# Patient Record
Sex: Female | Born: 1950 | Race: White | Hispanic: No | Marital: Married | Smoking: Never smoker
Health system: Southern US, Community
[De-identification: ages and names within clinical notes are randomized; demographics above are authoritative.]

## PROBLEM LIST (undated history)

## (undated) DIAGNOSIS — O24419 Gestational diabetes mellitus in pregnancy, unspecified control: Secondary | ICD-10-CM

## (undated) DIAGNOSIS — N281 Cyst of kidney, acquired: Secondary | ICD-10-CM

## (undated) DIAGNOSIS — B009 Herpesviral infection, unspecified: Secondary | ICD-10-CM

## (undated) DIAGNOSIS — D219 Benign neoplasm of connective and other soft tissue, unspecified: Secondary | ICD-10-CM

## (undated) DIAGNOSIS — H269 Unspecified cataract: Secondary | ICD-10-CM

## (undated) DIAGNOSIS — F32A Depression, unspecified: Secondary | ICD-10-CM

## (undated) DIAGNOSIS — F329 Major depressive disorder, single episode, unspecified: Secondary | ICD-10-CM

## (undated) DIAGNOSIS — B379 Candidiasis, unspecified: Secondary | ICD-10-CM

## (undated) DIAGNOSIS — G2581 Restless legs syndrome: Secondary | ICD-10-CM

## (undated) DIAGNOSIS — Z8619 Personal history of other infectious and parasitic diseases: Secondary | ICD-10-CM

## (undated) DIAGNOSIS — C50919 Malignant neoplasm of unspecified site of unspecified female breast: Secondary | ICD-10-CM

## (undated) DIAGNOSIS — E785 Hyperlipidemia, unspecified: Secondary | ICD-10-CM

## (undated) DIAGNOSIS — Z87898 Personal history of other specified conditions: Secondary | ICD-10-CM

## (undated) DIAGNOSIS — K219 Gastro-esophageal reflux disease without esophagitis: Secondary | ICD-10-CM

## (undated) DIAGNOSIS — N63 Unspecified lump in unspecified breast: Secondary | ICD-10-CM

## (undated) HISTORY — PX: ABDOMINAL HYSTERECTOMY: SHX81

## (undated) HISTORY — DX: Benign neoplasm of connective and other soft tissue, unspecified: D21.9

## (undated) HISTORY — DX: Herpesviral infection, unspecified: B00.9

## (undated) HISTORY — DX: Hyperlipidemia, unspecified: E78.5

## (undated) HISTORY — DX: Personal history of other specified conditions: Z87.898

## (undated) HISTORY — DX: Unspecified cataract: H26.9

## (undated) HISTORY — DX: Gestational diabetes mellitus in pregnancy, unspecified control: O24.419

## (undated) HISTORY — DX: Malignant neoplasm of unspecified site of unspecified female breast: C50.919

## (undated) HISTORY — DX: Unspecified lump in unspecified breast: N63.0

## (undated) HISTORY — PX: TUBAL LIGATION: SHX77

## (undated) HISTORY — DX: Restless legs syndrome: G25.81

## (undated) HISTORY — DX: Major depressive disorder, single episode, unspecified: F32.9

## (undated) HISTORY — DX: Personal history of other infectious and parasitic diseases: Z86.19

## (undated) HISTORY — DX: Gastro-esophageal reflux disease without esophagitis: K21.9

## (undated) HISTORY — PX: TONSILLECTOMY: SHX5217

## (undated) HISTORY — DX: Candidiasis, unspecified: B37.9

## (undated) HISTORY — DX: Depression, unspecified: F32.A

## (undated) HISTORY — DX: Cyst of kidney, acquired: N28.1

## (undated) HISTORY — PX: WISDOM TOOTH EXTRACTION: SHX21

---

## 1997-08-12 ENCOUNTER — Ambulatory Visit (HOSPITAL_COMMUNITY): Admission: RE | Admit: 1997-08-12 | Discharge: 1997-08-12 | Payer: Self-pay | Admitting: Specialist

## 1998-03-07 HISTORY — PX: BILATERAL SALPINGOOPHORECTOMY: SHX1223

## 1998-03-07 HISTORY — PX: TOTAL ABDOMINAL HYSTERECTOMY W/ BILATERAL SALPINGOOPHORECTOMY: SHX83

## 1998-04-07 ENCOUNTER — Other Ambulatory Visit: Admission: RE | Admit: 1998-04-07 | Discharge: 1998-04-07 | Payer: Self-pay | Admitting: Obstetrics and Gynecology

## 1998-05-12 ENCOUNTER — Inpatient Hospital Stay (HOSPITAL_COMMUNITY): Admission: RE | Admit: 1998-05-12 | Discharge: 1998-05-14 | Payer: Self-pay | Admitting: Obstetrics and Gynecology

## 2001-08-21 ENCOUNTER — Other Ambulatory Visit: Admission: RE | Admit: 2001-08-21 | Discharge: 2001-08-21 | Payer: Self-pay | Admitting: *Deleted

## 2001-09-04 ENCOUNTER — Other Ambulatory Visit: Admission: RE | Admit: 2001-09-04 | Discharge: 2001-09-04 | Payer: Self-pay | Admitting: Radiology

## 2001-09-04 DIAGNOSIS — C50919 Malignant neoplasm of unspecified site of unspecified female breast: Secondary | ICD-10-CM

## 2001-09-04 HISTORY — DX: Malignant neoplasm of unspecified site of unspecified female breast: C50.919

## 2001-09-12 ENCOUNTER — Other Ambulatory Visit: Admission: RE | Admit: 2001-09-12 | Discharge: 2001-09-12 | Payer: Self-pay | Admitting: Radiology

## 2001-09-24 ENCOUNTER — Encounter: Payer: Self-pay | Admitting: Surgery

## 2001-09-24 ENCOUNTER — Encounter: Admission: RE | Admit: 2001-09-24 | Discharge: 2001-09-24 | Payer: Self-pay | Admitting: Surgery

## 2001-09-26 ENCOUNTER — Encounter: Admission: RE | Admit: 2001-09-26 | Discharge: 2001-09-26 | Payer: Self-pay | Admitting: Surgery

## 2001-09-26 ENCOUNTER — Encounter: Payer: Self-pay | Admitting: Surgery

## 2001-09-26 ENCOUNTER — Encounter (INDEPENDENT_AMBULATORY_CARE_PROVIDER_SITE_OTHER): Payer: Self-pay | Admitting: *Deleted

## 2001-09-26 ENCOUNTER — Ambulatory Visit (HOSPITAL_BASED_OUTPATIENT_CLINIC_OR_DEPARTMENT_OTHER): Admission: RE | Admit: 2001-09-26 | Discharge: 2001-09-27 | Payer: Self-pay | Admitting: Surgery

## 2001-09-26 HISTORY — PX: AXILLARY LYMPH NODE DISSECTION: SHX5229

## 2001-09-26 HISTORY — PX: BREAST LUMPECTOMY: SHX2

## 2001-09-27 ENCOUNTER — Ambulatory Visit: Admission: RE | Admit: 2001-09-27 | Discharge: 2001-12-26 | Payer: Self-pay | Admitting: Radiation Oncology

## 2001-10-01 ENCOUNTER — Ambulatory Visit (HOSPITAL_COMMUNITY): Admission: RE | Admit: 2001-10-01 | Discharge: 2001-10-01 | Payer: Self-pay | Admitting: Oncology

## 2001-10-01 ENCOUNTER — Encounter: Payer: Self-pay | Admitting: Oncology

## 2001-10-04 ENCOUNTER — Ambulatory Visit (HOSPITAL_COMMUNITY): Admission: RE | Admit: 2001-10-04 | Discharge: 2001-10-04 | Payer: Self-pay | Admitting: Oncology

## 2001-10-04 ENCOUNTER — Encounter: Payer: Self-pay | Admitting: Oncology

## 2001-10-12 ENCOUNTER — Encounter: Payer: Self-pay | Admitting: Cardiology

## 2001-10-12 ENCOUNTER — Ambulatory Visit: Admission: RE | Admit: 2001-10-12 | Discharge: 2001-10-12 | Payer: Self-pay | Admitting: Oncology

## 2001-10-18 ENCOUNTER — Encounter: Payer: Self-pay | Admitting: Surgery

## 2001-10-18 ENCOUNTER — Ambulatory Visit (HOSPITAL_BASED_OUTPATIENT_CLINIC_OR_DEPARTMENT_OTHER): Admission: RE | Admit: 2001-10-18 | Discharge: 2001-10-18 | Payer: Self-pay | Admitting: Surgery

## 2002-01-09 ENCOUNTER — Ambulatory Visit: Admission: RE | Admit: 2002-01-09 | Discharge: 2002-01-09 | Payer: Self-pay | Admitting: Oncology

## 2002-01-09 ENCOUNTER — Encounter: Payer: Self-pay | Admitting: Cardiovascular Disease

## 2002-02-20 ENCOUNTER — Encounter: Payer: Self-pay | Admitting: Cardiovascular Disease

## 2002-02-20 ENCOUNTER — Ambulatory Visit: Admission: RE | Admit: 2002-02-20 | Discharge: 2002-02-20 | Payer: Self-pay | Admitting: Oncology

## 2002-02-20 ENCOUNTER — Ambulatory Visit: Admission: RE | Admit: 2002-02-20 | Discharge: 2002-05-01 | Payer: Self-pay | Admitting: Radiation Oncology

## 2002-02-22 ENCOUNTER — Encounter: Admission: RE | Admit: 2002-02-22 | Discharge: 2002-02-22 | Payer: Self-pay | Admitting: Radiation Oncology

## 2002-04-02 ENCOUNTER — Ambulatory Visit: Admission: RE | Admit: 2002-04-02 | Discharge: 2002-04-02 | Payer: Self-pay | Admitting: Oncology

## 2002-04-02 ENCOUNTER — Encounter (INDEPENDENT_AMBULATORY_CARE_PROVIDER_SITE_OTHER): Payer: Self-pay | Admitting: Cardiology

## 2002-06-25 ENCOUNTER — Encounter (INDEPENDENT_AMBULATORY_CARE_PROVIDER_SITE_OTHER): Payer: Self-pay | Admitting: Cardiology

## 2002-06-25 ENCOUNTER — Ambulatory Visit: Admission: RE | Admit: 2002-06-25 | Discharge: 2002-06-25 | Payer: Self-pay | Admitting: Oncology

## 2002-10-11 ENCOUNTER — Ambulatory Visit (HOSPITAL_BASED_OUTPATIENT_CLINIC_OR_DEPARTMENT_OTHER): Admission: RE | Admit: 2002-10-11 | Discharge: 2002-10-11 | Payer: Self-pay | Admitting: Surgery

## 2002-12-24 ENCOUNTER — Encounter: Payer: Self-pay | Admitting: Oncology

## 2002-12-24 ENCOUNTER — Ambulatory Visit (HOSPITAL_COMMUNITY): Admission: RE | Admit: 2002-12-24 | Discharge: 2002-12-24 | Payer: Self-pay | Admitting: Oncology

## 2003-04-02 ENCOUNTER — Ambulatory Visit: Admission: RE | Admit: 2003-04-02 | Discharge: 2003-04-02 | Payer: Self-pay | Admitting: Oncology

## 2003-04-02 ENCOUNTER — Encounter: Payer: Self-pay | Admitting: Cardiology

## 2003-10-08 ENCOUNTER — Ambulatory Visit (HOSPITAL_COMMUNITY): Admission: RE | Admit: 2003-10-08 | Discharge: 2003-10-08 | Payer: Self-pay | Admitting: Oncology

## 2004-01-09 ENCOUNTER — Ambulatory Visit: Payer: Self-pay | Admitting: Oncology

## 2004-04-08 ENCOUNTER — Ambulatory Visit: Payer: Self-pay | Admitting: Oncology

## 2004-09-21 ENCOUNTER — Ambulatory Visit (HOSPITAL_COMMUNITY): Admission: RE | Admit: 2004-09-21 | Discharge: 2004-09-21 | Payer: Self-pay | Admitting: Oncology

## 2004-10-15 ENCOUNTER — Ambulatory Visit: Payer: Self-pay | Admitting: Oncology

## 2005-03-23 ENCOUNTER — Ambulatory Visit: Payer: Self-pay | Admitting: Cardiovascular Disease

## 2005-03-23 ENCOUNTER — Ambulatory Visit: Admission: RE | Admit: 2005-03-23 | Discharge: 2005-03-23 | Payer: Self-pay | Admitting: Oncology

## 2005-03-23 ENCOUNTER — Encounter: Payer: Self-pay | Admitting: Cardiovascular Disease

## 2005-04-15 ENCOUNTER — Ambulatory Visit: Payer: Self-pay | Admitting: Oncology

## 2005-10-24 ENCOUNTER — Ambulatory Visit: Payer: Self-pay | Admitting: Oncology

## 2005-10-31 LAB — COMPREHENSIVE METABOLIC PANEL
Albumin: 4.5 g/dL (ref 3.5–5.2)
Alkaline Phosphatase: 83 U/L (ref 39–117)
CO2: 26 mEq/L (ref 19–32)
Chloride: 104 mEq/L (ref 96–112)
Glucose, Bld: 95 mg/dL (ref 70–99)
Sodium: 140 mEq/L (ref 135–145)
Total Bilirubin: 0.6 mg/dL (ref 0.3–1.2)

## 2005-10-31 LAB — CBC WITH DIFFERENTIAL/PLATELET
HCT: 36.7 % (ref 34.8–46.6)
HGB: 12.9 g/dL (ref 11.6–15.9)
LYMPH%: 33.4 % (ref 14.0–48.0)
MCHC: 35 g/dL (ref 32.0–36.0)
MCV: 93.3 fL (ref 81.0–101.0)
NEUT%: 57.6 % (ref 39.6–76.8)
Platelets: 265 10*3/uL (ref 145–400)
RDW: 12.3 % (ref 11.3–14.5)
WBC: 4.3 10*3/uL (ref 3.9–10.0)

## 2005-10-31 LAB — RESEARCH LABS

## 2005-10-31 LAB — CANCER ANTIGEN 27.29: CA 27.29: 12 U/mL (ref 0–39)

## 2006-04-26 ENCOUNTER — Ambulatory Visit: Payer: Self-pay | Admitting: Oncology

## 2006-05-01 LAB — COMPREHENSIVE METABOLIC PANEL
ALT: 11 U/L (ref 0–35)
AST: 16 U/L (ref 0–37)
Calcium: 8.6 mg/dL (ref 8.4–10.5)
Chloride: 106 mEq/L (ref 96–112)
Creatinine, Ser: 0.75 mg/dL (ref 0.40–1.20)
Potassium: 4 mEq/L (ref 3.5–5.3)
Sodium: 139 mEq/L (ref 135–145)
Total Protein: 6.9 g/dL (ref 6.0–8.3)

## 2006-05-01 LAB — CBC WITH DIFFERENTIAL/PLATELET
Basophils Absolute: 0 10*3/uL (ref 0.0–0.1)
Eosinophils Absolute: 0 10*3/uL (ref 0.0–0.5)
HGB: 13.2 g/dL (ref 11.6–15.9)
MCV: 92.6 fL (ref 81.0–101.0)
MONO#: 0.2 10*3/uL (ref 0.1–0.9)
NEUT%: 46.9 % (ref 39.6–76.8)
Platelets: 242 10*3/uL (ref 145–400)
RBC: 4.05 10*6/uL (ref 3.70–5.32)
RDW: 11.9 % (ref 11.3–14.5)
WBC: 2.9 10*3/uL — ABNORMAL LOW (ref 3.9–10.0)
lymph#: 1.2 10*3/uL (ref 0.9–3.3)

## 2006-05-01 LAB — CANCER ANTIGEN 27.29: CA 27.29: 14 U/mL (ref 0–39)

## 2006-05-08 ENCOUNTER — Ambulatory Visit (HOSPITAL_COMMUNITY): Admission: RE | Admit: 2006-05-08 | Discharge: 2006-05-08 | Payer: Self-pay | Admitting: Oncology

## 2006-10-18 ENCOUNTER — Ambulatory Visit: Payer: Self-pay | Admitting: Oncology

## 2006-10-23 LAB — CBC WITH DIFFERENTIAL/PLATELET
BASO%: 0.3 % (ref 0.0–2.0)
EOS%: 1.5 % (ref 0.0–7.0)
HCT: 37 % (ref 34.8–46.6)
LYMPH%: 39.1 % (ref 14.0–48.0)
MCH: 33.1 pg (ref 26.0–34.0)
MCHC: 35.7 g/dL (ref 32.0–36.0)
NEUT%: 52.1 % (ref 39.6–76.8)
RBC: 3.99 10*6/uL (ref 3.70–5.32)
lymph#: 1.2 10*3/uL (ref 0.9–3.3)

## 2006-10-23 LAB — COMPREHENSIVE METABOLIC PANEL
AST: 17 U/L (ref 0–37)
Alkaline Phosphatase: 51 U/L (ref 39–117)
BUN: 11 mg/dL (ref 6–23)
Creatinine, Ser: 0.76 mg/dL (ref 0.40–1.20)
Potassium: 4.2 mEq/L (ref 3.5–5.3)

## 2007-02-16 ENCOUNTER — Ambulatory Visit: Payer: Self-pay | Admitting: Oncology

## 2007-02-20 LAB — COMPREHENSIVE METABOLIC PANEL
BUN: 15 mg/dL (ref 6–23)
CO2: 26 mEq/L (ref 19–32)
Creatinine, Ser: 0.89 mg/dL (ref 0.40–1.20)
Glucose, Bld: 97 mg/dL (ref 70–99)
Total Bilirubin: 0.6 mg/dL (ref 0.3–1.2)

## 2007-02-20 LAB — CBC WITH DIFFERENTIAL/PLATELET
Eosinophils Absolute: 0.1 10*3/uL (ref 0.0–0.5)
HCT: 37.9 % (ref 34.8–46.6)
LYMPH%: 44.7 % (ref 14.0–48.0)
MCV: 92.2 fL (ref 81.0–101.0)
MONO#: 0.3 10*3/uL (ref 0.1–0.9)
MONO%: 8.8 % (ref 0.0–13.0)
NEUT#: 1.5 10*3/uL (ref 1.5–6.5)
NEUT%: 43.5 % (ref 39.6–76.8)
Platelets: 237 10*3/uL (ref 145–400)
WBC: 3.3 10*3/uL — ABNORMAL LOW (ref 3.9–10.0)

## 2007-02-20 LAB — CANCER ANTIGEN 27.29: CA 27.29: 8 U/mL (ref 0–39)

## 2007-04-24 ENCOUNTER — Ambulatory Visit (HOSPITAL_COMMUNITY): Admission: RE | Admit: 2007-04-24 | Discharge: 2007-04-24 | Payer: Self-pay | Admitting: Oncology

## 2007-04-25 ENCOUNTER — Ambulatory Visit: Payer: Self-pay | Admitting: Oncology

## 2007-04-30 LAB — CBC WITH DIFFERENTIAL/PLATELET
BASO%: 0.6 % (ref 0.0–2.0)
EOS%: 1 % (ref 0.0–7.0)
HCT: 37.2 % (ref 34.8–46.6)
HGB: 13.1 g/dL (ref 11.6–15.9)
LYMPH%: 49.9 % — ABNORMAL HIGH (ref 14.0–48.0)
MONO%: 6.3 % (ref 0.0–13.0)
NEUT%: 42.2 % (ref 39.6–76.8)
RDW: 11.6 % (ref 11.3–14.5)
lymph#: 1.9 10*3/uL (ref 0.9–3.3)

## 2007-04-30 LAB — COMPREHENSIVE METABOLIC PANEL
AST: 23 U/L (ref 0–37)
BUN: 12 mg/dL (ref 6–23)
Calcium: 9.7 mg/dL (ref 8.4–10.5)
Glucose, Bld: 103 mg/dL — ABNORMAL HIGH (ref 70–99)
Potassium: 4.1 mEq/L (ref 3.5–5.3)
Sodium: 140 mEq/L (ref 135–145)

## 2007-04-30 LAB — RESEARCH LABS

## 2007-05-16 ENCOUNTER — Ambulatory Visit: Payer: Self-pay

## 2007-05-16 ENCOUNTER — Encounter: Payer: Self-pay | Admitting: Oncology

## 2007-10-12 ENCOUNTER — Ambulatory Visit: Payer: Self-pay | Admitting: Oncology

## 2008-04-15 ENCOUNTER — Ambulatory Visit: Payer: Self-pay | Admitting: Oncology

## 2009-03-24 ENCOUNTER — Emergency Department (HOSPITAL_COMMUNITY): Admission: EM | Admit: 2009-03-24 | Discharge: 2009-03-24 | Payer: Self-pay | Admitting: Emergency Medicine

## 2009-04-17 ENCOUNTER — Ambulatory Visit: Payer: Self-pay | Admitting: Oncology

## 2009-04-22 LAB — CBC WITH DIFFERENTIAL/PLATELET
Basophils Absolute: 0 10*3/uL (ref 0.0–0.1)
Eosinophils Absolute: 0.1 10*3/uL (ref 0.0–0.5)
MCH: 32.5 pg (ref 25.1–34.0)
MCHC: 34.5 g/dL (ref 31.5–36.0)
MCV: 94 fL (ref 79.5–101.0)
MONO#: 0.4 10*3/uL (ref 0.1–0.9)
NEUT%: 57 % (ref 38.4–76.8)
RBC: 4.02 10*6/uL (ref 3.70–5.45)
RDW: 12.2 % (ref 11.2–14.5)

## 2009-04-22 LAB — COMPREHENSIVE METABOLIC PANEL
ALT: 14 U/L (ref 0–35)
Albumin: 4.3 g/dL (ref 3.5–5.2)
Alkaline Phosphatase: 106 U/L (ref 39–117)
Calcium: 8.7 mg/dL (ref 8.4–10.5)
Creatinine, Ser: 0.83 mg/dL (ref 0.40–1.20)
Total Bilirubin: 0.3 mg/dL (ref 0.3–1.2)
Total Protein: 6.8 g/dL (ref 6.0–8.3)

## 2009-04-22 LAB — CANCER ANTIGEN 27.29: CA 27.29: 7 U/mL (ref 0–39)

## 2010-03-28 ENCOUNTER — Encounter: Payer: Self-pay | Admitting: Oncology

## 2010-04-27 ENCOUNTER — Encounter (HOSPITAL_BASED_OUTPATIENT_CLINIC_OR_DEPARTMENT_OTHER): Payer: BC Managed Care – PPO | Admitting: Oncology

## 2010-04-27 ENCOUNTER — Other Ambulatory Visit: Payer: Self-pay | Admitting: Oncology

## 2010-04-27 DIAGNOSIS — M949 Disorder of cartilage, unspecified: Secondary | ICD-10-CM

## 2010-04-27 DIAGNOSIS — C50419 Malignant neoplasm of upper-outer quadrant of unspecified female breast: Secondary | ICD-10-CM

## 2010-04-27 DIAGNOSIS — Z170 Estrogen receptor positive status [ER+]: Secondary | ICD-10-CM

## 2010-04-27 LAB — CBC WITH DIFFERENTIAL/PLATELET
Eosinophils Absolute: 0.1 10*3/uL (ref 0.0–0.5)
HCT: 38.9 % (ref 34.8–46.6)
MCHC: 33.9 g/dL (ref 31.5–36.0)
MCV: 92.8 fL (ref 79.5–101.0)
MONO#: 0.4 10*3/uL (ref 0.1–0.9)
Platelets: 218 10*3/uL (ref 145–400)
RBC: 4.19 10*6/uL (ref 3.70–5.45)
RDW: 12.1 % (ref 11.2–14.5)
WBC: 4 10*3/uL (ref 3.9–10.3)
lymph#: 1.3 10*3/uL (ref 0.9–3.3)

## 2010-07-23 NOTE — Op Note (Signed)
   NAME:  CARNELL, CASAMENTO                          ACCOUNT NO.:  [id number]   MEDICAL RECORD NO.:  [id number]                   PATIENT TYPE:  AMB   LOCATION:  DSC                                  FACILITY:  [facility]   PHYSICIAN:  Currie Paris, M.D.           DATE OF BIRTH:  06-Dec-1950   DATE OF PROCEDURE:  10/11/2002  DATE OF DISCHARGE:                                 OPERATIVE REPORT   PREOPERATIVE DIAGNOSIS:  Unneeded Port-A-Cath.   POSTOPERATIVE DIAGNOSIS:  Unneeded Port-A-Cath.   OPERATION:  Removal of Port-A-Cath.   SURGEON:  Currie Paris, M.D.   ANESTHESIA:  Local.   CLINICAL HISTORY:  This patient has finished all of her chemotherapy and  wished to have her Port-A-Cath removed.   DESCRIPTION OF PROCEDURE:  In the minor procedure room the patient confirmed  that Port-A-Cath removal was planned.  The area over the Port-A-Cath was  anesthetized with 1% Xylocaine using a little alcohol prep.  After about 10  minutes the area was prepped, draped, and the old scar opened.  The X-Port  was identified and the two holding sutures cut.  The tubing was backed out  and the port removed easily.  There was no significant bleeding.   The incision was closed with 3-0 Vicryl followed by Dermabond.   The patient tolerated the procedure well, and there were no complications  noted.                                               Currie Paris, M.D.    CJS/MEDQ  D:  10/11/2002  T:  10/12/2002  Job:  [id number]

## 2010-07-23 NOTE — Op Note (Signed)
Ariel Mosley, Ariel Mosley                            ACCOUNT NO.:  [id number]   MEDICAL RECORD NO.:  [id number]                    PATIENT TYPE:   LOCATION:                                       FACILITY:   PHYSICIAN:  Currie Paris, M.D.           DATE OF BIRTH:   DATE OF PROCEDURE:  10/18/2001  DATE OF DISCHARGE:                                 OPERATIVE REPORT   CCS [id number].   PREOPERATIVE DIAGNOSIS:  Carcinoma of the right breast.   POSTOPERATIVE DIAGNOSIS:  Carcinoma of the right breast.   PROCEDURE:  Placement of Port-A-Cath.   SURGEON:  Currie Paris, M.D.   ANESTHESIA:  MAC.   HISTORY:  This patient is getting ready to undergo chemotherapy starting  later today.  She needs Port-A-Cath access for IV access for her  chemotherapy.   DESCRIPTION OF PROCEDURE:  The patient was seen in the holding area, and she  had no further questions.  She was taken to the operating room and given IV  sedation.  The upper chest and lower neck areas were prepped and draped as a  single sterile field.  The patient was placed in Trendelenburg position.   With a single attempt, the left subclavian vein was accessed and the  guidewire threaded easily in.  Fluoroscopy showed it to be in the superior  vena cava.   Additional local was infiltrated over the anterior chest wall and a  transverse incision made and a subcutaneous pocket fashioned for the Port-A-  Cath reservoir.  Once that was done, a tunnel was made and the Port-A-Cath  tubing brought from the reservoir site into the guidewire site.   The guidewire tract was dilated once with the provided dilator and the peel-  away sheath.  This went easily, and the dilator and guidewire were removed.  The Port-A-Cath tubing was threaded in to approximately 25 cm.   Using fluoroscopy, I then back it up and left it where it appeared to be at  the junction of the superior vena cava and right atrium and measured at 18  cm.  It aspirated  and irrigated easily.   The reservoir was flushed and attached.  It aspirated and irrigated easily.  It was then sutured down to the fascia using the two sutures and the suture  rings in the reservoir.   Fluoroscopy showed it to be in good position, no kinks, good positioning of  the tip.   The incision was closed with some 3-0 Vicryl, with 4-0 Monocryl subcuticular  and Steri-Strips.  I used the access tubing to access this so she could have  her chemotherapy today.  I aspirated once and flushed with 20 cc of dilute  heparin followed by 6 cc of concentrated aqueous heparin as a heparin lock.   The patient tolerated the procedure well.  There were no operative  complications.  All counts were correct.  Currie Paris, M.D.    CJS/MEDQ  D:  10/18/2001  T:  10/20/2001  Job:  [id number]

## 2010-07-23 NOTE — Op Note (Signed)
[place]  Patient:    Ariel, Mosley Visit Number: [id number] MRN: [id number]          Service Type: DSU Location: [facility] Attending Physician:  Charlton Haws Dictated by:   Currie Paris, M.D. Proc. Date: 09/26/01 Admit Date:  09/26/2001 Discharge Date: 09/27/2001   CC:         Jeralyn Ruths, M.D.  Carolyne Fiscal, M.D.  Laqueta Linden, M.D.   Operative Report  OFFICE MEDICAL RECORD NUMBER:  [id number]  PREOPERATIVE DIAGNOSIS:  Carcinoma right breast upper outer quadrant.  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  OPERATION:  Right needle guided "lumpectomy" with blue dye injection, sentinel node and full nodal dissection.  SURGEON:  Currie Paris, M.D.  ANESTHESIA:  General.  CLINICAL HISTORY:  This patient has presented with a breast cancer diagnosed on core biopsy in the upper outer quadrant of the right breast.  It was nonpalpable.  She had no obvious nodal metastases by physical examination.  DESCRIPTION OF PROCEDURE:  The patient was seen in the holding area and had no further questions.  She had a guide wire placed and the films were reviewed. In addition, she had the right breast injected with her radioactive nucleotide.  The patient was taken to the operating room and after satisfactory general anesthesia the right breast was prepped and draped as a sterile field. Lymphazurin blue 4 cc was injected subareolarly.  With the breast retracted medially, I made an elliptical incision around the guide wire which entered laterally and traveled deep and medially.  Using cautery, I stayed well around the area in question and took what I thought was a fairly large lumpectomy specimen with the needle completely contained within the specimen.  I put marking sutures and clips on to label it and sent it for specimen mammography.  Bleeders were  electrocoagulated and once everything appeared to be dry, the wound was packed.  Attention was turned to the axilla and transverse incision was made once I used the neoprobe to identify one hot area.  Almost immediately upon getting down to the axillary fatty tissue, I saw a blue lymphatic and found a blue lymph node.  Using neoprobe it was hot.  This was excised and sent as a hot, blue sentinel lymph node.  Using the neoprobe, I found another hot area just a little bit posterior to the first area and found another blue node there and excised it.  There were no other hot areas in the axilla and I could not find any other blue nodes on gentle dissection nor any that felt enlarged by palpation.  While waiting for pathology, both incisions were closed using Vicryl on the deeper tissues and monocryl on the skin.  I injected Marcaine to help with postoperative analgesia.  I used clips in the breast to mark the margins of excision.  Unfortunately, pathology reported that one of the two nodes was positive.  In addition, although the preps of the lumpectomy were negative, we were fairly close in the superior margin.  I therefore, elected to perform complete nodal dissection as the patient and I had discussed preoperatively as well as taking additional superior margin.  The axillary incision was opened first.  The holding deeper sutures were removed.  Using cautery I divided the fatty tissue down to the chest wall from the latissimus laterally to the pectoralis medially.  I then went along the clavipectoral fascia  up in the axilla.  The subintercostal nerves were divided and clipped.  The axillary vein was identified and the axillary contents were stripped from medial to lateral.  The final lateral attachments were divided with the cautery.  Vessels were clipped as needed.  The long thoracodorsal nerves were identified and preserved.  I did not extend the dissection above the vein and left the  vein relatively unstripped.  The incision was checked for hemostasis and everything appeared to be dry. The nerves were checked and worked.  A #19 Blake drain was placed.  The wound was again closed in layers with 3-0 Vicryl and 4-0 monocryl.  Attention was returned to the breast incision which was reopened and the deeper sutures were removed.  Using cautery, I took approximately another cm of thick area of superior margin going all the way from the medial to the lateral aspect of the incision.  There was nothing here grossly that looked like tumor.  Bleeders were then electrocoagulated or tied with Vicryl and the incision reclosed with Vicryl and at this time we used staples on the skin because there was still a little bit of tension because of the extra tissue removed.  The patient tolerated the procedure well.  There were no operative complications.  All counts were correct. Dictated by:   Currie Paris, M.D. Attending Physician:  Charlton Haws DD:  09/26/01 TD:  09/29/01 Job: [id number]

## 2010-12-28 ENCOUNTER — Encounter: Payer: Self-pay | Admitting: Internal Medicine

## 2011-01-19 ENCOUNTER — Encounter: Payer: Self-pay | Admitting: *Deleted

## 2011-04-22 ENCOUNTER — Other Ambulatory Visit: Payer: Self-pay | Admitting: *Deleted

## 2011-04-25 ENCOUNTER — Other Ambulatory Visit: Payer: Self-pay | Admitting: Oncology

## 2011-04-25 DIAGNOSIS — C50919 Malignant neoplasm of unspecified site of unspecified female breast: Secondary | ICD-10-CM

## 2011-04-28 ENCOUNTER — Telehealth: Payer: Self-pay | Admitting: *Deleted

## 2011-04-28 ENCOUNTER — Other Ambulatory Visit (HOSPITAL_BASED_OUTPATIENT_CLINIC_OR_DEPARTMENT_OTHER): Payer: BC Managed Care – PPO | Admitting: Lab

## 2011-04-28 ENCOUNTER — Other Ambulatory Visit: Payer: Self-pay | Admitting: Oncology

## 2011-04-28 ENCOUNTER — Ambulatory Visit: Payer: BC Managed Care – PPO | Admitting: Oncology

## 2011-04-28 ENCOUNTER — Ambulatory Visit (HOSPITAL_BASED_OUTPATIENT_CLINIC_OR_DEPARTMENT_OTHER): Payer: BC Managed Care – PPO

## 2011-04-28 VITALS — BP 130/78 | HR 71 | Temp 98.3°F | Ht 65.5 in | Wt 199.7 lb

## 2011-04-28 DIAGNOSIS — C50919 Malignant neoplasm of unspecified site of unspecified female breast: Secondary | ICD-10-CM

## 2011-04-28 DIAGNOSIS — C50419 Malignant neoplasm of upper-outer quadrant of unspecified female breast: Secondary | ICD-10-CM

## 2011-04-28 LAB — COMPREHENSIVE METABOLIC PANEL
Albumin: 4.2 g/dL (ref 3.5–5.2)
Alkaline Phosphatase: 91 U/L (ref 39–117)
CO2: 22 mEq/L (ref 19–32)
Calcium: 9.4 mg/dL (ref 8.4–10.5)
Chloride: 102 mEq/L (ref 96–112)
Glucose, Bld: 99 mg/dL (ref 70–99)
Potassium: 4 mEq/L (ref 3.5–5.3)
Sodium: 138 mEq/L (ref 135–145)
Total Protein: 7.3 g/dL (ref 6.0–8.3)

## 2011-04-28 LAB — CBC WITH DIFFERENTIAL/PLATELET
Basophils Absolute: 0 10*3/uL (ref 0.0–0.1)
Eosinophils Absolute: 0.1 10*3/uL (ref 0.0–0.5)
HCT: 38.2 % (ref 34.8–46.6)
HGB: 13.2 g/dL (ref 11.6–15.9)
MONO#: 0.3 10*3/uL (ref 0.1–0.9)
NEUT#: 2.6 10*3/uL (ref 1.5–6.5)
RDW: 12.2 % (ref 11.2–14.5)
lymph#: 1.8 10*3/uL (ref 0.9–3.3)

## 2011-04-28 MED ORDER — ZOLEDRONIC ACID 4 MG/100ML IV SOLN
4.0000 mg | Freq: Once | INTRAVENOUS | Status: AC
  Administered 2011-04-28: 4 mg via INTRAVENOUS
  Filled 2011-04-28: qty 100

## 2011-04-28 MED ORDER — SODIUM CHLORIDE 0.9 % IV SOLN
Freq: Once | INTRAVENOUS | Status: AC
  Administered 2011-04-28: 15:00:00 via INTRAVENOUS

## 2011-04-28 NOTE — Progress Notes (Signed)
ID: SAFA DERNER   DOB: 03-01-1951  MR#: [id number]  MVH#:[id number]  HISTORY OF PRESENT ILLNESS:  INTERVAL HISTORY: Ariel Mosley returns for followup of her remote breast cancer. Interval history is generally unremarkable. 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 Family is doing fine, and her dogs does see and Publishing copy healthy by forcing her to walk 45 minutes everyday.   REVIEW OF SYSTEMS:  Sometimes she has a little bit of discomfort or tenderness in the right armpit area, below her axillary scar. This is very intermittent and has not changed in frequency or intensity over the last several years. She has absolutely no cardiac symptoms and particularly no chest pain or pressure, shortness of breath, palpitations or other concerns. A detailed review of systems was otherwise entirely unremarkable   PAST MEDICAL HISTORY: Past Medical History  Diagnosis Date  . Breast cancer July 2003  . Renal cyst     PAST SURGICAL HISTORY: Past Surgical History  Procedure Date  . Breast lumpectomy 09/26/2001    right  . Axillary lymph node dissection 09/26/2001  . Total abdominal hysterectomy w/ bilateral salpingoophorectomy 2000    for fibroids  . Tonsillectomy     FAMILY HISTORY No family history on file.  GYNECOLOGIC HISTORY:  SOCIAL HISTORY:    ADVANCED DIRECTIVES:  HEALTH MAINTENANCE: History  Substance Use Topics  . Smoking status: Not on file  . Smokeless tobacco: Not on file  . Alcohol Use:      Colonoscopy:  PAP:  Bone density: 3/11  Lipid panel:  Allergies  Allergen Reactions  . Penicillins Hives    Per 09/28/2001 New Patient Evaluation (Marquavion Venhuizen).    Current Outpatient Prescriptions  Medication Sig Dispense Refill  . Calcium Carbonate-Vitamin D (CALCIUM + D PO) Take 2 tablets by mouth daily. Takes 2 x a week.       . Cholecalciferol (VITAMIN D PO) Take by mouth once a week.        . Omega-3 Fatty Acids (OMEGA 3 PO) Take by mouth daily.        Marland Kitchen  zolendronic acid (ZOMETA) 4 MG/5ML injection Inject 4 mg into the vein every 6 (six) months. For osteoporosis.         OBJECTIVE: Filed Vitals:   04/28/11 1332  BP: 130/78  Pulse: 71  Temp: 98.3 F (36.8 C)     Body mass index is 32.73 kg/(m^2).    ECOG FS: 0  Sclerae unicteric Oropharynx clear No peripheral adenopathy, and specifically the whole right leg sclerae area is entirely normal. Lungs no rales or rhonchi Heart regular rate and rhythm Abd benign MSK no focal spinal tenderness, no peripheral edema Neuro: nonfocal Breasts: Right breast status post lumpectomy and radiation no evidence of local recurrence. Left breast no suspicious findings  LAB RESULTS: Lab Results  Component Value Date   WBC 4.8 04/28/2011   NEUTROABS 2.6 04/28/2011   HGB 13.2 04/28/2011   HCT 38.2 04/28/2011   MCV 90.7 04/28/2011   PLT 227 04/28/2011      Chemistry      Component Value Date/Time   NA 139 04/22/2009 1423   K 3.8 04/22/2009 1423   CL 103 04/22/2009 1423   CO2 24 04/22/2009 1423   BUN 15 04/22/2009 1423   CREATININE 0.83 04/22/2009 1423      Component Value Date/Time   CALCIUM 8.7 04/22/2009 1423   ALKPHOS 106 04/22/2009 1423   AST 16 04/22/2009 1423   ALT  14 04/22/2009 1423   BILITOT 0.3 04/22/2009 1423       Lab Results  Component Value Date   LABCA2 7 04/22/2009    No results found for this basename: INR:1;PROTIME:1 in the last 168 hours  No results found for this basename: UACOL:1,UAPR:1,USPG:1,UPH:1,UTP:1,UGL:1,UKET:1,UBIL:1,UHGB:1,UNIT:1,UROB:1,ULEU:1,UEPI:1,UWBC:1,URBC:1,UBAC:1,CAST:1,CRYS:1,UCOM:1,BILUA:1 in the last 72 hours   STUDIES: No new studies  found. She is due for repeat bone density next month and repeat mammography September of 2013  ASSESSMENT: This is a 61- year-old [place] woman status post right lumpectomy and axillary lymph node dissection July of 2003 for a T2 N1, Stage II-A invasive ductal carcinoma, Grade 3, triple positive, treated according to  BCIRG-006 with carboplatin, Taxotere and Herceptin completed in July of 2004, followed by radiation and hormonal therapy, with Arimidex completed December of 2008.  She is status post TAH-BSO.  She has a history of osteopenia and has been on zoledronic acid since February of 2009 with excellent tolerance.  PLAN: There is no evidence of disease recurrence and there are no cardiac concerns or other symptoms related to her cancer or its treatment. She will receive zoledronic acid today and likely this will be her final dose. I will see her 1 more time a year from now. She knows to call for any problems that may develop before that  Daysie Helf C    04/28/2011

## 2011-04-28 NOTE — Telephone Encounter (Signed)
gave patient appointment for 2014 with the lab being a week ahead time printed out calendar and gave to the patient

## 2011-04-28 NOTE — Progress Notes (Signed)
04/28/2011 1:20pm Patient in to clinic today for BCIRG 006 follow-up 18 visit. Patient denies any cardiac disorders in the past year. She is fully active, without restrictions or symptoms. She is aware that the final study visit is scheduled to occur in one year, to complete 10 years of follow-up.

## 2011-04-29 LAB — VITAMIN D 25 HYDROXY (VIT D DEFICIENCY, FRACTURES): Vit D, 25-Hydroxy: 30 ng/mL (ref 30–89)

## 2011-04-29 LAB — CANCER ANTIGEN 27.29: CA 27.29: 23 U/mL (ref 0–39)

## 2011-10-26 ENCOUNTER — Ambulatory Visit (INDEPENDENT_AMBULATORY_CARE_PROVIDER_SITE_OTHER): Payer: BC Managed Care – PPO | Admitting: Obstetrics and Gynecology

## 2011-10-26 ENCOUNTER — Telehealth: Payer: Self-pay | Admitting: Obstetrics and Gynecology

## 2011-10-26 ENCOUNTER — Telehealth: Payer: Self-pay

## 2011-10-26 ENCOUNTER — Encounter: Payer: Self-pay | Admitting: Obstetrics and Gynecology

## 2011-10-26 VITALS — BP 120/66 | HR 64 | Temp 98.8°F | Resp 16 | Ht 65.5 in | Wt 188.0 lb

## 2011-10-26 DIAGNOSIS — N63 Unspecified lump in unspecified breast: Secondary | ICD-10-CM | POA: Insufficient documentation

## 2011-10-26 DIAGNOSIS — C50919 Malignant neoplasm of unspecified site of unspecified female breast: Secondary | ICD-10-CM | POA: Insufficient documentation

## 2011-10-26 DIAGNOSIS — D219 Benign neoplasm of connective and other soft tissue, unspecified: Secondary | ICD-10-CM | POA: Insufficient documentation

## 2011-10-26 DIAGNOSIS — B379 Candidiasis, unspecified: Secondary | ICD-10-CM | POA: Insufficient documentation

## 2011-10-26 DIAGNOSIS — N898 Other specified noninflammatory disorders of vagina: Secondary | ICD-10-CM

## 2011-10-26 DIAGNOSIS — B009 Herpesviral infection, unspecified: Secondary | ICD-10-CM | POA: Insufficient documentation

## 2011-10-26 DIAGNOSIS — O24419 Gestational diabetes mellitus in pregnancy, unspecified control: Secondary | ICD-10-CM | POA: Insufficient documentation

## 2011-10-26 LAB — POCT WET PREP (WET MOUNT)

## 2011-10-26 NOTE — Telephone Encounter (Signed)
Pelvic CT with Contrast scheduled for 10/27/11; Precert obtained from [facility] per Pam; Reference # [id number]. -Adrianne Pridgen

## 2011-10-26 NOTE — Progress Notes (Signed)
NEW PATIENT   GYN PROBLEM VISIT  Vaginal D/C  [name] is a 61 y.o. year old female,G3P0012, who presents for a problem visit.   Subjective:  Pt states that she was seen at PCP for Pap Smear (09/2011) which informed her that she had a brownish d/c. States that she doesn't see it nor notice any smell.. Also c/o diarrhea for the past 2 weeks, which is very unusual for her. Color: brownish Odor: no Itching:no Thin:no Thick:no Fever:no Dyspareunia:no Hx PID:no HX STD:yes Herpes Pelvic Pain:no Desires Gc/CT:no Desires HIV,RPR,HbsAG:no   Objective:  BP 120/66  Pulse 64  Temp 98.8 F (37.1 C)  Resp 16  Ht 5' 5.5" (1.664 m)  Wt 188 lb (85.276 kg)  BMI 30.81 kg/m2   External genitalia: normal general appearance Vaginal: atrophic mucosa, discharge, brown thick mucoid and vaginal vault, well suspended and healed.  No defect noted Cervix: removed surgically Adnexa: removed surgically Uterus: removed surgically Rectal: good sphincter tone and no masses  Assessment: New asymptomatic discharge high in vagina New onset diarrhea  R/o fistula  Plan: Wet prep shows many WBC's but no Trich, clue cells or yeast Pelvic CT with rectal contrast to try to R/O fistula If neg, will refer to GI  For W/U of diarrhea Return to office TBA   Dierdre Forth, MD  10/26/2011 9:02 AM

## 2011-10-26 NOTE — Telephone Encounter (Signed)
Tc to pt. Appt sched 10/27/11 with [facility] for CT scan. Pt voices understanding.

## 2011-10-27 ENCOUNTER — Ambulatory Visit
Admission: RE | Admit: 2011-10-27 | Discharge: 2011-10-27 | Disposition: A | Discharge status: Home / Self Care | Payer: BC Managed Care – PPO | Source: Ambulatory Visit | Attending: Obstetrics and Gynecology | Admitting: Obstetrics and Gynecology

## 2011-10-27 ENCOUNTER — Other Ambulatory Visit: Payer: BC Managed Care – PPO

## 2011-10-27 DIAGNOSIS — N898 Other specified noninflammatory disorders of vagina: Secondary | ICD-10-CM

## 2011-10-27 MED ORDER — IOHEXOL 300 MG/ML  SOLN
100.0000 mL | Freq: Once | INTRAMUSCULAR | Status: AC | PRN
  Administered 2011-10-27: 100 mL via INTRAVENOUS

## 2011-10-27 MED ORDER — IOHEXOL 300 MG/ML  SOLN
20.0000 mL | Freq: Once | INTRAMUSCULAR | Status: AC | PRN
  Administered 2011-10-27: 20 mL

## 2011-10-28 ENCOUNTER — Telehealth: Payer: Self-pay | Admitting: Obstetrics and Gynecology

## 2011-10-28 NOTE — Telephone Encounter (Signed)
Tc to pt per telephone call. Told pt will cb with CT scan results after review by Dr. Pennie Rushing. Pt voices understanding.

## 2011-10-31 ENCOUNTER — Telehealth: Payer: Self-pay

## 2011-10-31 NOTE — Telephone Encounter (Signed)
Tc to pt per CT scan results. Informed pt needs follow up appt in September per vph. Pt told CT scan=No rectovaginal fistula seen and no acute abnormality. Appt sched 12/01/11 with vph to follow up. Pt voices understanding.

## 2011-11-02 NOTE — Telephone Encounter (Signed)
Tc to pt per vph recs. Pt voices understanding. Appt sched prev for 12/01/11 with vph. Pt will keep appt for re eval of discharge. Pt agrees.

## 2011-11-02 NOTE — Telephone Encounter (Signed)
No rectovaginal fistula noted.  Rec OTC Replens or Luvena 2 times weekly and follow up with me after 6 weeks of use to re-evaluate discharge. F/U with Dr Alberteen Sam for evaluation of diarrhea.

## 2011-11-14 ENCOUNTER — Other Ambulatory Visit: Payer: Self-pay | Admitting: Dermatology

## 2011-11-25 DIAGNOSIS — Z87898 Personal history of other specified conditions: Secondary | ICD-10-CM | POA: Insufficient documentation

## 2011-11-25 DIAGNOSIS — O24419 Gestational diabetes mellitus in pregnancy, unspecified control: Secondary | ICD-10-CM | POA: Insufficient documentation

## 2011-11-25 DIAGNOSIS — Z8619 Personal history of other infectious and parasitic diseases: Secondary | ICD-10-CM | POA: Insufficient documentation

## 2011-12-01 ENCOUNTER — Encounter: Payer: Self-pay | Admitting: Obstetrics and Gynecology

## 2011-12-01 ENCOUNTER — Ambulatory Visit (INDEPENDENT_AMBULATORY_CARE_PROVIDER_SITE_OTHER): Payer: BC Managed Care – PPO | Admitting: Obstetrics and Gynecology

## 2011-12-01 ENCOUNTER — Other Ambulatory Visit: Payer: Self-pay | Admitting: *Deleted

## 2011-12-01 VITALS — BP 108/66 | Ht 65.0 in | Wt 181.0 lb

## 2011-12-01 DIAGNOSIS — N898 Other specified noninflammatory disorders of vagina: Secondary | ICD-10-CM

## 2011-12-01 DIAGNOSIS — N952 Postmenopausal atrophic vaginitis: Secondary | ICD-10-CM

## 2011-12-01 DIAGNOSIS — C50919 Malignant neoplasm of unspecified site of unspecified female breast: Secondary | ICD-10-CM

## 2011-12-01 HISTORY — DX: Other specified noninflammatory disorders of vagina: N89.8

## 2011-12-01 NOTE — Progress Notes (Signed)
GYN PROBLEM VISIT F/U   Subjective: [name] is a 61 y.o. year old female,G3P0012, who presents for a problem visit.  Pt here to f/u on ct of pelvis 10/28/2011 which showed no evidence of rectovaginal fistula.  She has used [place] twice weekly.  She never had any sx of discharge so has noted no change.  Objective:  BP 108/66  Ht 5\' 5"  (1.651 m)  Wt 181 lb (82.101 kg)  BMI 30.12 kg/m2    External genitalia: normal general appearance Vaginal: atrophic mucosa and vaginal vault, well healed.  no discharge.  Assessment: Atrophic vaginitis No evidence of R-V fistula  Plan: Continue Luvenia weekly Continue annual pelvic exams with PCP RTO prn or if desired by pt.   Dierdre Forth, MD  12/01/2011 10:49 AM

## 2011-12-02 ENCOUNTER — Telehealth: Payer: Self-pay | Admitting: *Deleted

## 2011-12-02 NOTE — Telephone Encounter (Signed)
Left voicemessage to inform the patient of the new date and time for the mammogram at [facility] on 12-08-2011 at 10:15am

## 2012-01-04 ENCOUNTER — Other Ambulatory Visit: Payer: Self-pay | Admitting: Dermatology

## 2012-02-07 ENCOUNTER — Telehealth: Payer: Self-pay | Admitting: *Deleted

## 2012-02-07 ENCOUNTER — Other Ambulatory Visit: Payer: Self-pay | Admitting: *Deleted

## 2012-02-07 NOTE — Telephone Encounter (Signed)
Left voice message to inform the patient of the new date and time for the lab only appointment on 04-02-2012 at 9:30am  Left voice message to inform the patient of the new date and time for the md only appointment on 04-09-2012 at 9:30am  Per the orders from 02-07-2012 cindy shaw the research nurse

## 2012-03-30 ENCOUNTER — Other Ambulatory Visit: Payer: Self-pay | Admitting: *Deleted

## 2012-03-30 DIAGNOSIS — C50919 Malignant neoplasm of unspecified site of unspecified female breast: Secondary | ICD-10-CM

## 2012-04-02 ENCOUNTER — Other Ambulatory Visit (HOSPITAL_BASED_OUTPATIENT_CLINIC_OR_DEPARTMENT_OTHER): Payer: BC Managed Care – PPO | Admitting: Lab

## 2012-04-02 DIAGNOSIS — C50419 Malignant neoplasm of upper-outer quadrant of unspecified female breast: Secondary | ICD-10-CM

## 2012-04-02 DIAGNOSIS — C50919 Malignant neoplasm of unspecified site of unspecified female breast: Secondary | ICD-10-CM

## 2012-04-02 LAB — COMPREHENSIVE METABOLIC PANEL (CC13)
AST: 19 U/L (ref 5–34)
BUN: 13.6 mg/dL (ref 7.0–26.0)
Calcium: 9.5 mg/dL (ref 8.4–10.4)
Chloride: 106 mEq/L (ref 98–107)
Creatinine: 0.9 mg/dL (ref 0.6–1.1)
Total Bilirubin: 0.52 mg/dL (ref 0.20–1.20)

## 2012-04-02 LAB — CBC WITH DIFFERENTIAL/PLATELET
BASO%: 0.6 % (ref 0.0–2.0)
HCT: 38.3 % (ref 34.8–46.6)
LYMPH%: 31.5 % (ref 14.0–49.7)
MCH: 31.9 pg (ref 25.1–34.0)
MCHC: 34 g/dL (ref 31.5–36.0)
MCV: 93.8 fL (ref 79.5–101.0)
MONO#: 0.3 10*3/uL (ref 0.1–0.9)
MONO%: 7.7 % (ref 0.0–14.0)
NEUT%: 58.3 % (ref 38.4–76.8)
Platelets: 238 10*3/uL (ref 145–400)
WBC: 4.5 10*3/uL (ref 3.9–10.3)

## 2012-04-09 ENCOUNTER — Encounter: Payer: Self-pay | Admitting: *Deleted

## 2012-04-09 ENCOUNTER — Ambulatory Visit (HOSPITAL_BASED_OUTPATIENT_CLINIC_OR_DEPARTMENT_OTHER): Payer: BC Managed Care – PPO | Admitting: Oncology

## 2012-04-09 VITALS — BP 122/79 | HR 67 | Temp 98.6°F | Resp 20 | Ht 65.0 in | Wt 183.0 lb

## 2012-04-09 DIAGNOSIS — C50919 Malignant neoplasm of unspecified site of unspecified female breast: Secondary | ICD-10-CM

## 2012-04-09 DIAGNOSIS — Z853 Personal history of malignant neoplasm of breast: Secondary | ICD-10-CM

## 2012-04-09 NOTE — Progress Notes (Signed)
ID: Ariel Mosley   DOB: [birth year]  MR#: [id number]  CSN#:[id number]  PCP: Birdena Jubilee, MD GYN: Dierdre Forth SU: OTHER MD:  INTERVAL HISTORY: Ariel Mosley returns for followup of her remote breast cancer. Interval history is generally unremarkable. 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 Family is doing fine, and her one problem is that her 2 sons have not yet produced any grandchildren. Since her last visit here she has also had a mole removed from her left anterior chest, which showed some atypia, but no evidence of cancer.  REVIEW OF SYSTEMS: She has very minimal urinary stress incontinence. She has absolutely no other symptoms in a detailed review of systems. In particular she has no chest pain or pressure, palpitations, shortness of breath, or other evidence of adverse events.  PAST MEDICAL HISTORY: Past Medical History  Diagnosis Date  . Breast cancer July 2003  . Renal cyst   . Yeast infection   . Herpes   . Fibroids   . Breast lump   . Gestational diabetes   . History of chicken pox   . History of measles, mumps, or rubella     PAST SURGICAL HISTORY: Past Surgical History  Procedure Date  . Breast lumpectomy 09/26/2001    right  . Axillary lymph node dissection 09/26/2001  . Total abdominal hysterectomy w/ bilateral salpingoophorectomy 2000    for fibroids  . Tonsillectomy   . Wisdom tooth extraction   . Tubal ligation     FAMILY HISTORY Family History  Problem Relation Age of Onset  . 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 At home it is just she and her husband.   ADVANCED DIRECTIVES:  HEALTH MAINTENANCE: History  Substance Use Topics  . Smoking status: Never Smoker   . Smokeless tobacco: Never Used  . Alcohol Use: No   Allergies  Allergen Reactions  . Penicillins Hives    Per 09/28/2001 New Patient Evaluation (Yonas Bunda).    Current Outpatient  Prescriptions  Medication Sig Dispense Refill  . Calcium Carbonate-Vitamin D (CALCIUM + D PO) Take 2 tablets by mouth daily. Takes 2 x a week.       . Cholecalciferol (VITAMIN D PO) Take by mouth once a week.        . gabapentin (NEURONTIN) 300 MG capsule Take 300 mg by mouth 3 (three) times daily.      Marland Kitchen MELATONIN PO Take by mouth.      . Omega-3 Fatty Acids (OMEGA 3 PO) Take by mouth daily.        . pravastatin (PRAVACHOL) 40 MG tablet Take 40 mg by mouth daily.      Marland Kitchen zolendronic acid (ZOMETA) 4 MG/5ML injection Inject 4 mg into the vein every 6 (six) months. For osteoporosis.         OBJECTIVE: There were no vitals filed for this visit.   There is no height or weight on file to calculate BMI.    ECOG FS: 0  KFS: 100%  Sclerae unicteric Oropharynx clear No cervical or supraclavicular adenopathy Lungs no rales or rhonchi Heart regular rate and rhythm Abd benign MSK no focal spinal tenderness, no peripheral edema Neuro: nonfocal, well oriented, positive affect Breasts: Right breast status post lumpectomy and radiation no evidence of local recurrence. The right exam is benign. Left breast no suspicious findings  LAB RESULTS: Lab Results  Component Value  Date   WBC 4.5 04/02/2012   NEUTROABS 2.6 04/02/2012   HGB 13.0 04/02/2012   HCT 38.3 04/02/2012   MCV 93.8 04/02/2012   PLT 238 04/02/2012      Chemistry      Component Value Date/Time   NA 141 04/02/2012 0941   NA 138 04/28/2011 1459   K 4.2 04/02/2012 0941   K 4.0 04/28/2011 1459   CL 106 04/02/2012 0941   CL 102 04/28/2011 1459   CO2 26 04/02/2012 0941   CO2 22 04/28/2011 1459   BUN 13.6 04/02/2012 0941   BUN 13 04/28/2011 1459   CREATININE 0.9 04/02/2012 0941   CREATININE 0.80 04/28/2011 1459      Component Value Date/Time   CALCIUM 9.5 04/02/2012 0941   CALCIUM 9.4 04/28/2011 1459   ALKPHOS 88 04/02/2012 0941   ALKPHOS 91 04/28/2011 1459   AST 19 04/02/2012 0941   AST 21 04/28/2011 1459   ALT 16 04/02/2012 0941   ALT 14  04/28/2011 1459   BILITOT 0.52 04/02/2012 0941   BILITOT 0.4 04/28/2011 1459       Lab Results  Component Value Date   LABCA2 8 04/02/2012    No results found for this basename: INR:1;PROTIME:1 in the last 168 hours  No results found for this basename: UACOL:1,UAPR:1,USPG:1,UPH:1,UTP:1,UGL:1,UKET:1,UBIL:1,UHGB:1,UNIT:1,UROB:1,ULEU:1,UEPI:1,UWBC:1,URBC:1,UBAC:1,CAST:1,CRYS:1,UCOM:1,BILUA:1 in the last 72 hours   STUDIES: No results found.   ASSESSMENT: This is a 62- year-old [place] woman status post right lumpectomy and axillary lymph node dissection July of 2003 for a T2 N1, Stage II-A invasive ductal carcinoma, Grade 3, triple positive, treated according to BCIRG-006 with carboplatin, Taxotere and Herceptin completed in July of 2004, followed by radiation and hormonal therapy, with Arimidex completed December of 2008.  She is status post TAH-BSO.  She has a history of osteopenia and received zoledronic acid yearly between February of 2009 and February 2013 with excellent tolerance.  PLAN: Ariel Mosley is doing great from a breast cancer point of view, and she "graduated" from followup here today. She understands we will be glad to see her at any point in the future if the need arises, but we are making no further appointments for her here. From a breast cancer point of view all she will need is a yearly mammogram, which usually obtained in October, and a yearly physician breast exam. 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  [name]    04/09/2012

## 2012-04-09 NOTE — Progress Notes (Signed)
Patient in to clinic today for final study visit for the BCIR[facility] 006 protocol. Patient is fully active without restrictions (KPS=100%). She denies any side effects related to treatment, and specifically has had no cardiac events since her last follow-up visit. She reports having an atypical mole (non-malignant) removed from her left anterior chest by Dr. Campbell Stall last year.  Patient and MD are aware that this concludes her participation in the study, and that any future follow-up is at the discretion of the physician. Per Dr. Darnelle Catalan, patient will be released to primary care for future follow-up. Patient is in agreement with this plan, and understands that she is free to return to this clinic for additional evaluations or concerns at any time. Thanked patient for her participation in the study.

## 2012-04-19 ENCOUNTER — Other Ambulatory Visit: Payer: BC Managed Care – PPO | Admitting: Lab

## 2012-04-26 ENCOUNTER — Ambulatory Visit: Payer: BC Managed Care – PPO | Admitting: Oncology

## 2012-06-06 ENCOUNTER — Ambulatory Visit: Payer: BC Managed Care – PPO | Admitting: Family Medicine

## 2012-06-07 ENCOUNTER — Other Ambulatory Visit: Payer: Self-pay | Admitting: Dermatology

## 2012-06-13 ENCOUNTER — Telehealth: Payer: Self-pay | Admitting: *Deleted

## 2012-06-13 MED ORDER — BUPROPION HCL ER (SR) 200 MG PO TB12: 200.0000 mg | ORAL_TABLET | Freq: Two times a day (BID) | ORAL | Status: DC

## 2012-06-13 NOTE — Telephone Encounter (Signed)
She is due for labs and see Dr Alberteen Sam.  Tell her we are giving her 30 days for her med but we need to see her before she runs out again.  Please schedule her. Thanks PG

## 2012-06-13 NOTE — Telephone Encounter (Signed)
PT NEEDS REFILL ON RX- [facility] DOES NOT HAVE ANY LEFT.   PHARMACY - [facility] ON [address] [place]  PT WANTED TO KNOW IF SHE WAS DUE FOR LABS OR ANYTHING ELSE.

## 2012-07-02 ENCOUNTER — Other Ambulatory Visit: Payer: Self-pay | Admitting: Dermatology

## 2012-07-16 ENCOUNTER — Other Ambulatory Visit: Payer: BC Managed Care – PPO

## 2012-07-18 ENCOUNTER — Other Ambulatory Visit: Payer: BC Managed Care – PPO | Admitting: *Deleted

## 2012-07-18 ENCOUNTER — Telehealth: Payer: Self-pay | Admitting: *Deleted

## 2012-07-18 DIAGNOSIS — Z139 Encounter for screening, unspecified: Secondary | ICD-10-CM

## 2012-07-18 LAB — CBC WITH DIFFERENTIAL/PLATELET
Basophils Absolute: 0 10*3/uL (ref 0.0–0.1)
Basophils Relative: 1 % (ref 0–1)
Eosinophils Absolute: 0.1 10*3/uL (ref 0.0–0.7)
Eosinophils Relative: 3 % (ref 0–5)
HCT: 37.5 % (ref 36.0–46.0)
Hemoglobin: 12.9 g/dL (ref 12.0–15.0)
Lymphocytes Relative: 36 % (ref 12–46)
Lymphs Abs: 1.4 10*3/uL (ref 0.7–4.0)
MCH: 30.9 pg (ref 26.0–34.0)
MCHC: 34.4 g/dL (ref 30.0–36.0)
MCV: 89.9 fL (ref 78.0–100.0)
Monocytes Absolute: 0.3 10*3/uL (ref 0.1–1.0)
Monocytes Relative: 8 % (ref 3–12)
Neutro Abs: 2 10*3/uL (ref 1.7–7.7)
Neutrophils Relative %: 52 % (ref 43–77)
Platelets: 295 10*3/uL (ref 150–400)
RBC: 4.17 MIL/uL (ref 3.87–5.11)
RDW: 13.1 % (ref 11.5–15.5)
WBC: 3.8 10*3/uL — ABNORMAL LOW (ref 4.0–10.5)

## 2012-07-18 LAB — COMPREHENSIVE METABOLIC PANEL
ALT: 20 U/L (ref 0–35)
AST: 21 U/L (ref 0–37)
Albumin: 4.3 g/dL (ref 3.5–5.2)
Alkaline Phosphatase: 72 U/L (ref 39–117)
BUN: 15 mg/dL (ref 6–23)
CO2: 25 mEq/L (ref 19–32)
Calcium: 9.2 mg/dL (ref 8.4–10.5)
Chloride: 104 mEq/L (ref 96–112)
Creat: 0.83 mg/dL (ref 0.50–1.10)
Glucose, Bld: 104 mg/dL — ABNORMAL HIGH (ref 70–99)
Potassium: 4.2 mEq/L (ref 3.5–5.3)
Sodium: 139 mEq/L (ref 135–145)
Total Bilirubin: 0.6 mg/dL (ref 0.3–1.2)
Total Protein: 6.8 g/dL (ref 6.0–8.3)

## 2012-07-18 LAB — LIPID PANEL
Cholesterol: 195 mg/dL (ref 0–200)
HDL: 77 mg/dL (ref >39)
LDL Cholesterol: 109 mg/dL — ABNORMAL HIGH (ref 0–99)
Total CHOL/HDL Ratio: 2.5 Ratio
Triglycerides: 47 mg/dL (ref <150)
VLDL: 9 mg/dL (ref 0–40)

## 2012-07-18 LAB — TSH: TSH: 1.568 u[IU]/mL (ref 0.350–4.500)

## 2012-07-18 NOTE — Telephone Encounter (Signed)
Labs only

## 2012-07-19 LAB — VITAMIN D 25 HYDROXY (VIT D DEFICIENCY, FRACTURES): Vit D, 25-Hydroxy: 35 ng/mL (ref 30–89)

## 2012-07-24 ENCOUNTER — Ambulatory Visit: Payer: BC Managed Care – PPO | Admitting: Family Medicine

## 2012-08-02 ENCOUNTER — Ambulatory Visit: Payer: BC Managed Care – PPO | Admitting: Family Medicine

## 2012-08-03 ENCOUNTER — Encounter: Payer: Self-pay | Admitting: *Deleted

## 2012-08-06 ENCOUNTER — Encounter: Payer: Self-pay | Admitting: Family Medicine

## 2012-08-06 ENCOUNTER — Ambulatory Visit (INDEPENDENT_AMBULATORY_CARE_PROVIDER_SITE_OTHER): Payer: BC Managed Care – PPO | Admitting: Family Medicine

## 2012-08-06 VITALS — BP 127/81 | HR 58 | Wt 192.0 lb

## 2012-08-06 DIAGNOSIS — G2581 Restless legs syndrome: Secondary | ICD-10-CM

## 2012-08-06 DIAGNOSIS — F32A Depression, unspecified: Secondary | ICD-10-CM

## 2012-08-06 DIAGNOSIS — E785 Hyperlipidemia, unspecified: Secondary | ICD-10-CM

## 2012-08-06 DIAGNOSIS — F329 Major depressive disorder, single episode, unspecified: Secondary | ICD-10-CM

## 2012-08-06 DIAGNOSIS — F3289 Other specified depressive episodes: Secondary | ICD-10-CM

## 2012-08-06 MED ORDER — PRAVASTATIN SODIUM 40 MG PO TABS: 40.0000 mg | ORAL_TABLET | Freq: Every day | ORAL | Status: DC

## 2012-08-06 MED ORDER — BUPROPION HCL ER (SR) 200 MG PO TB12: 200.0000 mg | ORAL_TABLET | Freq: Two times a day (BID) | ORAL | Status: DC

## 2012-08-06 MED ORDER — ROPINIROLE HCL 2 MG PO TABS: ORAL_TABLET | ORAL | Status: DC

## 2012-08-06 NOTE — Patient Instructions (Addendum)
Restless Legs Syndrome Restless legs syndrome is a movement disorder. It may also be called a sensori-motor disorder.  CAUSES  No one knows what specifically causes restless legs syndrome, but it tends to run in families. It is also more common in people with low iron, in pregnancy, in people who need dialysis, and those with nerve damage (neuropathy).Some medications may make restless legs syndrome worse.Those medications include drugs to treat high blood pressure, some heart conditions, nausea, colds, allergies, and depression. SYMPTOMS Symptoms include uncomfortable sensations in the legs. These leg sensations are worse during periods of inactivity or rest. They are also worse while sitting or lying down. Individuals that have the disorder describe sensations in the legs that feel like:  Pulling.  Drawing.  Crawling.  Worming.  Boring.  Tingling.  Pins and needles.  Prickling.  Pain. The sensations are usually accompanied by an overwhelming urge to move the legs. Sudden muscle jerks may also occur. Movement provides temporary relief from the discomfort. In rare cases, the arms may also be affected. Symptoms may interfere with going to sleep (sleep onset insomnia). Restless legs syndrome may also be related to periodic limb movement disorder (PLMD). PLMD is another more common motor disorder. It also causes interrupted sleep. The symptoms from PLMD usually occur most often when you are awake. TREATMENT  Treatment for restless legs syndrome is symptomatic. This means that the symptoms are treated.   Massage and cold compresses may provide temporary relief.  Walk, stretch, or take a cold or hot bath.  Get regular exercise and a good night's sleep.  Avoid caffeine, alcohol, nicotine, and medications that can make it worse.  Do activities that provide mental stimulation like discussions, needlework, and video games. These may be helpful if you are not able to walk or  stretch. Some medications are effective in relieving the symptoms. However, many of these medications have side effects. Ask your caregiver about medications that may help your symptoms. Correcting iron deficiency may improve symptoms for some patients. Document Released: 02/11/2002 Document Revised: 05/16/2011 Document Reviewed: 05/20/2010 [employer] Patient Information 2014 [employer].  

## 2012-08-07 ENCOUNTER — Encounter: Payer: Self-pay | Admitting: Family Medicine

## 2012-08-07 DIAGNOSIS — F329 Major depressive disorder, single episode, unspecified: Secondary | ICD-10-CM | POA: Insufficient documentation

## 2012-08-07 DIAGNOSIS — G2581 Restless legs syndrome: Secondary | ICD-10-CM | POA: Insufficient documentation

## 2012-08-07 DIAGNOSIS — F32A Depression, unspecified: Secondary | ICD-10-CM | POA: Insufficient documentation

## 2012-08-07 DIAGNOSIS — E785 Hyperlipidemia, unspecified: Secondary | ICD-10-CM | POA: Insufficient documentation

## 2012-08-07 NOTE — Progress Notes (Signed)
  Subjective:    Patient ID: Ariel Mosley, female    DOB: [birth year], 62 y.o.   MRN: [id number]  HPI  Ariel Mosley is here to follow up on her labs, get medication refills and discuss the following conditions listed below:    1)  Restless Leg Syndrome -  She is taking Requip without difficulty.  She needs to have it refilled.    2)  Hyperlipidemia - She has been taking her pravastatin regularly. She does not have any complaints.   3)  Mood - Her mood is stable on Wellbutrin. She needs a refill.     Review of Systems  Constitutional: Negative for activity change, fatigue and unexpected weight change.  HENT: Negative.   Eyes: Negative.   Respiratory: Negative for shortness of breath.   Cardiovascular: Negative for chest pain, palpitations and leg swelling.  Gastrointestinal: Negative for diarrhea and constipation.  Endocrine: Negative.   Genitourinary: Negative for difficulty urinating.  Musculoskeletal: Negative.   Skin: Negative.   Neurological: Negative.   Hematological: Negative for adenopathy. Does not bruise/bleed easily.  Psychiatric/Behavioral: Negative for sleep disturbance and dysphoric mood. The patient is not nervous/anxious.         Objective:   Physical Exam  Constitutional: She appears well-nourished. No distress.  HENT:  Head: Normocephalic.  Eyes: No scleral icterus.  Neck: No thyromegaly present.  Cardiovascular: Normal rate, regular rhythm and normal heart sounds.   Pulmonary/Chest: Effort normal and breath sounds normal.  Abdominal: There is no tenderness.  Musculoskeletal: She exhibits no edema and no tenderness.  Neurological: She is alert.  Skin: Skin is warm and dry.  Psychiatric: She has a normal mood and affect. Her behavior is normal. Judgment and thought content normal.      Assessment & Plan:

## 2012-08-07 NOTE — Assessment & Plan Note (Signed)
Her lipid panel is improved. She will remain on pravastatin.

## 2012-08-07 NOTE — Assessment & Plan Note (Signed)
Refilled her Wellbutrin.   

## 2012-08-07 NOTE — Assessment & Plan Note (Signed)
Her RLS symptoms are pretty well controlled on the Requip but she appears to need a higher dosage.  She will increase her dosage from .5 mg to 1 or 2 mg.

## 2012-10-01 ENCOUNTER — Ambulatory Visit (INDEPENDENT_AMBULATORY_CARE_PROVIDER_SITE_OTHER): Payer: BC Managed Care – PPO | Admitting: Family Medicine

## 2012-10-01 ENCOUNTER — Encounter: Payer: Self-pay | Admitting: Family Medicine

## 2012-10-01 VITALS — BP 121/81 | HR 60 | Ht 65.5 in | Wt 192.0 lb

## 2012-10-01 DIAGNOSIS — Z0000 Encounter for general adult medical examination without abnormal findings: Secondary | ICD-10-CM

## 2012-10-01 DIAGNOSIS — E669 Obesity, unspecified: Secondary | ICD-10-CM

## 2012-10-01 LAB — POCT URINALYSIS DIPSTICK
Bilirubin, UA: NEGATIVE
Blood, UA: NEGATIVE
Glucose, UA: NEGATIVE
Ketones, UA: NEGATIVE
Leukocytes, UA: NEGATIVE
Nitrite, UA: NEGATIVE
Protein, UA: NEGATIVE
Spec Grav, UA: <=1.005
Urobilinogen, UA: NEGATIVE
pH, UA: 8

## 2012-10-01 MED ORDER — PHENTERMINE-TOPIRAMATE ER 3.75-23 MG PO CP24: 1.0000 | ORAL_CAPSULE | Freq: Every day | ORAL | Status: DC

## 2012-10-01 MED ORDER — PHENTERMINE-TOPIRAMATE ER 7.5-46 MG PO CP24: 1.0000 | ORAL_CAPSULE | Freq: Every day | ORAL | Status: DC

## 2012-10-01 NOTE — Patient Instructions (Addendum)
1)  Weight - Try the Qsymia for 3 months limitig your calories to 1200 or less. Cut down your Wellbutrin once you start the higher dosage to 1 per day at lunch. Get 1 hour of exercise per day.     2)  Colonoscopy - Check with your insurance - Compare Dr. Loreta Ave with Dr. Myra Gianotti.  3)  [facility] - I'll send a note to Dr. Darnelle Catalan.  Check with [place] in a week or so to see what he says.     Exercise to Lose Weight Exercise and a healthy diet may help you lose weight. Your doctor may suggest specific exercises. EXERCISE IDEAS AND TIPS  Choose low-cost things you enjoy doing, such as walking, bicycling, or exercising to workout videos.  Take stairs instead of the elevator.  Walk during your lunch break.  Park your car further away from work or school.  Go to a gym or an exercise class.  Start with 5 to 10 minutes of exercise each day. Build up to 30 minutes of exercise 4 to 6 days a week.  Wear shoes with good support and comfortable clothes.  Stretch before and after working out.  Work out until you breathe harder and your heart beats faster.  Drink extra water when you exercise.  Do not do so much that you hurt yourself, feel dizzy, or get very short of breath. Exercises that burn about 150 calories:  Running 1  miles in 15 minutes.  Playing volleyball for 45 to 60 minutes.  Washing and waxing a car for 45 to 60 minutes.  Playing touch football for 45 minutes.  Walking 1  miles in 35 minutes.  Pushing a stroller 1  miles in 30 minutes.  Playing basketball for 30 minutes.  Raking leaves for 30 minutes.  Bicycling 5 miles in 30 minutes.  Walking 2 miles in 30 minutes.  Dancing for 30 minutes.  Shoveling snow for 15 minutes.  Swimming laps for 20 minutes.  Walking up stairs for 15 minutes.  Bicycling 4 miles in 15 minutes.  Gardening for 30 to 45 minutes.  Jumping rope for 15 minutes.  Washing windows or floors for 45 to 60 minutes. Document Released:  03/26/2010 Document Revised: 05/16/2011 Document Reviewed: 03/26/2010 [facility] Patient Information 2014 [place], [place].

## 2012-10-01 NOTE — Progress Notes (Signed)
Subjective:    Patient ID: Ariel Mosley, female    DOB: 1950/10/08, 62 y.o.   MRN: [id number]  HPI  [name] is here today for her annual CPE.  Overall, she has done well since her last visit.  Her only concern is her weight.  She takes Wellbutrin daily and it no longer does very much for her appetite.  She has seen Dr. Campbell Stall recently who removed a couple of "abnormal" lesions.  She also followed up with Dr. Pennie Rushing after her last GYN exam to be checked for a rectovaginal fistula. She does not have one of them.    Review of Systems  Constitutional: Negative.        She has been having difficulty with losing weight.    HENT: Negative.   Eyes: Negative.   Respiratory: Negative.   Cardiovascular: Negative.   Gastrointestinal: Negative.   Endocrine: Negative.   Genitourinary: Negative.   Musculoskeletal: Negative.   Skin: Negative.   Allergic/Immunologic: Negative.   Neurological: Negative.   Hematological: Negative.   Psychiatric/Behavioral: Negative.     Past Medical History  Diagnosis Date  . Breast cancer July 2003  . Renal cyst   . Yeast infection   . Herpes   . Fibroids   . Breast lump   . Gestational diabetes   . History of chicken pox   . History of measles, mumps, or rubella   . RLS (restless legs syndrome)   . Hyperlipidemia   . Depression    Family History  Problem Relation Age of Onset  . Heart disease Mother   . Hyperlipidemia Mother   . Alzheimer's disease Mother   . Heart disease Maternal Grandmother   . Hyperlipidemia Maternal Grandmother    History   Social History Narrative   Marital Status: Married Film/video editor)   Children:  Sons (Ben, Will)    Pets: Dogs (2)    Living Situation: Lives with husband   Occupation: Database administrator (Education officer, environmental at [employer])   Education: Engineer, maintenance (IT)    Tobacco Use/Exposure:  None    Alcohol Use:  Occasional   Drug Use:  None   Diet:  Regular   Exercise:  Walking (4-5 X week)    Hobbies:  Walking/ Reading/ Sewing                  Objective:   Physical Exam  Constitutional: She is oriented to person, place, and time. She appears well-developed and well-nourished.  HENT:  Head: Normocephalic and atraumatic.  Right Ear: External ear normal.  Left Ear: External ear normal.  Nose: Nose normal.  Mouth/Throat: Oropharynx is clear and moist.  Eyes: Conjunctivae and EOM are normal. Pupils are equal, round, and reactive to light.  Neck: Normal range of motion. No thyromegaly present.  Cardiovascular: Normal rate, regular rhythm, normal heart sounds and intact distal pulses.  Exam reveals no gallop and no friction rub.   No murmur heard. Pulmonary/Chest: Effort normal and breath sounds normal. Right breast exhibits no inverted nipple, no mass, no nipple discharge, no skin change and no tenderness. Left breast exhibits no inverted nipple, no mass, no nipple discharge, no skin change and no tenderness. Breasts are symmetrical.  Abdominal: Soft. Bowel sounds are normal. Hernia confirmed negative in the right inguinal area and confirmed negative in the left inguinal area.  Genitourinary: Rectum normal. Rectal exam shows no external hemorrhoid and no tenderness. Pelvic exam was performed with patient supine. There is no rash, tenderness  or lesion on the right labia. There is no rash, tenderness or lesion on the left labia. No erythema or tenderness around the vagina. No foreign body around the vagina. Vaginal discharge (Light Brown Vaginal Discharge.  ) found.  Musculoskeletal: Normal range of motion. She exhibits no edema and no tenderness.  Lymphadenopathy:    She has no cervical adenopathy.       Right: No inguinal adenopathy present.       Left: No inguinal adenopathy present.  Neurological: She is alert and oriented to person, place, and time. She has normal reflexes.  Skin: Skin is warm and dry.  Psychiatric: She has a normal mood and affect. Her behavior is normal. Judgment and  thought content normal.          Assessment & Plan:

## 2012-10-02 DIAGNOSIS — Z0000 Encounter for general adult medical examination without abnormal findings: Secondary | ICD-10-CM | POA: Insufficient documentation

## 2012-10-02 DIAGNOSIS — E669 Obesity, unspecified: Secondary | ICD-10-CM | POA: Insufficient documentation

## 2012-10-02 NOTE — Assessment & Plan Note (Addendum)
She was given a prescription for Qsyma at today's visit.  She is to limit her calories to 1200 or less.

## 2012-10-02 NOTE — Assessment & Plan Note (Signed)
Normal exam; we discussed preventative issues for her age. We feel that she needs a colonoscopy.  She was given information on Dr. Loreta Ave and Dr. Myra Gianotti.

## 2013-01-11 ENCOUNTER — Ambulatory Visit (INDEPENDENT_AMBULATORY_CARE_PROVIDER_SITE_OTHER): Payer: BC Managed Care – PPO | Admitting: Family Medicine

## 2013-01-11 ENCOUNTER — Encounter: Payer: Self-pay | Admitting: Family Medicine

## 2013-01-11 VITALS — BP 103/71 | HR 62 | Resp 16 | Ht 65.3 in | Wt 169.0 lb

## 2013-01-11 DIAGNOSIS — R059 Cough, unspecified: Secondary | ICD-10-CM

## 2013-01-11 DIAGNOSIS — R05 Cough: Secondary | ICD-10-CM

## 2013-01-11 DIAGNOSIS — E669 Obesity, unspecified: Secondary | ICD-10-CM

## 2013-01-11 MED ORDER — PHENTERMINE-TOPIRAMATE ER 7.5-46 MG PO CP24: 1.0000 | ORAL_CAPSULE | Freq: Every day | ORAL | Status: DC

## 2013-01-11 MED ORDER — BENZONATATE 200 MG PO CAPS: 200.0000 mg | ORAL_CAPSULE | Freq: Three times a day (TID) | ORAL | Status: DC | PRN

## 2013-01-11 MED ORDER — HYDROCODONE-HOMATROPINE 5-1.5 MG/5ML PO SYRP: 5.0000 mL | ORAL_SOLUTION | Freq: Four times a day (QID) | ORAL | Status: DC | PRN

## 2013-01-11 NOTE — Progress Notes (Signed)
Subjective:    Patient ID: Ariel Mosley, female    DOB: 1950-12-24, 62 y.o.   MRN: [id number]  HPI  Ariel Mosley is here today to discuss a couple of issues . She has been taking Qsymia since her last visit and has done great on it.  She has lost 17 lbs and would like to continue on it.  She has also developed a cough and needs medication for it.    Review of Systems  Constitutional: Positive for appetite change.       17 lb weight loss   HENT: Negative.   Eyes: Negative.   Respiratory: Positive for cough.   Cardiovascular: Negative.   Gastrointestinal: Negative.   Endocrine: Negative.   Genitourinary: Negative.   Musculoskeletal: Negative.   Skin: Negative.   Allergic/Immunologic: Negative.   Neurological: Negative.   Hematological: Negative.   Psychiatric/Behavioral: Negative.      Past Medical History  Diagnosis Date  . Breast cancer July 2003  . Yeast infection   . Herpes     Genital   . Fibroids   . Breast lump   . History of chicken pox   . History of measles, mumps, or rubella     She had mumps and measles.  . RLS (restless legs syndrome)   . Hyperlipidemia   . Depression   . Gestational diabetes     With her last pregnancy only.    . Renal cyst      Past Surgical History  Procedure Laterality Date  . Breast lumpectomy  09/26/2001    Right   . Axillary lymph node dissection  09/26/2001  . Total abdominal hysterectomy w/ bilateral salpingoophorectomy  2000    Fibroids   . Tonsillectomy    . Wisdom tooth extraction    . Tubal ligation    . Bilateral salpingoophorectomy  2000     History   Social History Narrative   Marital Status: Married Film/video editor)   Children:  Sons (Romeo Apple, Will)    Pets: Dogs (2)    Living Situation: Lives with husband   Occupation: Database administrator (Education officer, environmental at [employer])   Education: Engineer, maintenance (IT)    Tobacco Use/Exposure:  None    Alcohol Use:  Occasional   Drug Use:  None   Diet:  Regular   Exercise:  Walking  (4-5 X week)    Hobbies: Herbalist               Family History  Problem Relation Age of Onset  . Heart disease Mother   . Hyperlipidemia Mother   . Alzheimer's disease Mother   . Heart disease Maternal Grandmother   . Hyperlipidemia Maternal Grandmother   . Cancer Paternal Uncle     Lung     Current Outpatient Prescriptions on File Prior to Visit  Medication Sig Dispense Refill  . Cholecalciferol (VITAMIN D PO) Take by mouth once a week.        Marland Kitchen MELATONIN PO Take by mouth.      . pravastatin (PRAVACHOL) 40 MG tablet Take 1 tablet (40 mg total) by mouth at bedtime.  90 tablet  3  . rOPINIRole (REQUIP) 2 MG tablet Take 1/2 - 1 tab po at bedtime  30 tablet  11   No current facility-administered medications on file prior to visit.     Allergies  Allergen Reactions  . Penicillins Hives    Per 09/28/2001 New Patient Evaluation (Magrinat).     Immunization  History  Administered Date(s) Administered  . Tdap 05/18/2007  . Zoster 09/07/2011       Objective:   Physical Exam  Vitals reviewed. Constitutional: She is oriented to person, place, and time. She appears well-developed and well-nourished.  Cardiovascular: Normal rate and regular rhythm.   Pulmonary/Chest: Effort normal and breath sounds normal.  Neurological: She is alert and oriented to person, place, and time.  Skin: Skin is warm and dry.  Psychiatric: She has a normal mood and affect.     Assessment & Plan:    Ariel Mosley was seen today for weight loss and cough.  Diagnoses and associated orders for this visit:  Cough - HYDROcodone-homatropine (HYCODAN) 5-1.5 MG/5ML syrup; Take 5 mLs by mouth every 6 (six) hours as needed for cough. - benzonatate (TESSALON) 200 MG capsule; Take 1 capsule (200 mg total) by mouth 3 (three) times daily as needed for cough.  Obesity, unspecified - Phentermine-Topiramate (QSYMIA) 7.5-46 MG CP24; Take 1 tablet by mouth daily before breakfast.

## 2013-01-11 NOTE — Patient Instructions (Signed)
1)   Weight - Continue on your current dosage of the Qsymia. To help decrease the symptoms you mentioned you can try the following:  A)  Sleep -   Cool, DARK room Sleep Time/Sweet Dreams Melatonin (5 mg)  Benadryl (Liquid vs capsule) 6.25 mg - 25 mg or you can try Zyrtec  Hot Bath/ Epsom Salt   B)  Constipation  Stool Softener (Colace - 100 mg 1-2 times per day)  Miralax (1 cap) up to 3 x per day Metamucil (1 Tablespoon) Prunes/Prune Juice/Plum Juice  Probiotics (Align)  Activia Yogurt  Exercise Water  Fiber (30 grams or more) Glycerin Suppository    C)  Memory/Brain Fog  L - Tyrosine (can take twice what the OTC dosage suggests)   D)  Taste   Think of it as a benefit.      Insomnia Insomnia is frequent trouble falling and/or staying asleep. Insomnia can be a long term problem or a short term problem. Both are common. Insomnia can be a short term problem when the wakefulness is related to a certain stress or worry. Long term insomnia is often related to ongoing stress during waking hours and/or poor sleeping habits. Overtime, sleep deprivation itself can make the problem worse. Every little thing feels more severe because you are overtired and your ability to cope is decreased. CAUSES   Stress, anxiety, and depression.  Poor sleeping habits.  Distractions such as TV in the bedroom.  Naps close to bedtime.  Engaging in emotionally charged conversations before bed.  Technical reading before sleep.  Alcohol and other sedatives. They may make the problem worse. They can hurt normal sleep patterns and normal dream activity.  Stimulants such as caffeine for several hours prior to bedtime.  Pain syndromes and shortness of breath can cause insomnia.  Exercise late at night.  Changing time zones may cause sleeping problems (jet lag). It is sometimes helpful to have someone observe your sleeping patterns. They should look for periods of not breathing during the night  (sleep apnea). They should also look to see how long those periods last. If you live alone or observers are uncertain, you can also be observed at a sleep clinic where your sleep patterns will be professionally monitored. Sleep apnea requires a checkup and treatment. Give your caregivers your medical history. Give your caregivers observations your family has made about your sleep.  SYMPTOMS   Not feeling rested in the morning.  Anxiety and restlessness at bedtime.  Difficulty falling and staying asleep. TREATMENT   Your caregiver may prescribe treatment for an underlying medical disorders. Your caregiver can give advice or help if you are using alcohol or other drugs for self-medication. Treatment of underlying problems will usually eliminate insomnia problems.  Medications can be prescribed for short time use. They are generally not recommended for lengthy use.  Over-the-counter sleep medicines are not recommended for lengthy use. They can be habit forming.  You can promote easier sleeping by making lifestyle changes such as:  Using relaxation techniques that help with breathing and reduce muscle tension.  Exercising earlier in the day.  Changing your diet and the time of your last meal. No night time snacks.  Establish a regular time to go to bed.  Counseling can help with stressful problems and worry.  Soothing music and white noise may be helpful if there are background noises you cannot remove.  Stop tedious detailed work at least one hour before bedtime. HOME CARE INSTRUCTIONS   Keep  a diary. Inform your caregiver about your progress. This includes any medication side effects. See your caregiver regularly. Take note of:  Times when you are asleep.  Times when you are awake during the night.  The quality of your sleep.  How you feel the next day. This information will help your caregiver care for you.  Get out of bed if you are still awake after 15 minutes. Read or do  some quiet activity. Keep the lights down. Wait until you feel sleepy and go back to bed.  Keep regular sleeping and waking hours. Avoid naps.  Exercise regularly.  Avoid distractions at bedtime. Distractions include watching television or engaging in any intense or detailed activity like attempting to balance the household checkbook.  Develop a bedtime ritual. Keep a familiar routine of bathing, brushing your teeth, climbing into bed at the same time each night, listening to soothing music. Routines increase the success of falling to sleep faster.  Use relaxation techniques. This can be using breathing and muscle tension release routines. It can also include visualizing peaceful scenes. You can also help control troubling or intruding thoughts by keeping your mind occupied with boring or repetitive thoughts like the old concept of counting sheep. You can make it more creative like imagining planting one beautiful flower after another in your backyard garden.  During your day, work to eliminate stress. When this is not possible use some of the previous suggestions to help reduce the anxiety that accompanies stressful situations. MAKE SURE YOU:   Understand these instructions.  Will watch your condition.  Will get help right away if you are not doing well or get worse. Document Released: 02/19/2000 Document Revised: 05/16/2011 Document Reviewed: 03/21/2007 [facility] Patient Information 2014 [place], [place].

## 2013-03-09 IMAGING — CT CT PELVIS W/ CM
1 of 3 series · 15 of 36 positions shown, 19 images · IV contrast (READICAT,OMNI EN & [ID] OMNI 300)
Comparison: 12/24/2002.

CLINICAL DATA: Unusual vaginal discharge on recent gynecological
examination.  Previous hysterectomy and bilateral oophorectomy.
Clinical concern for the possibility of a rectovaginal fistula.
The patient has had some diarrhea.  Previous lumpectomy and
chemotherapy for breast cancer in 9441.

CT PELVIS WITH CONTRAST
TECHNIQUE: Multidetector CT imaging of the pelvis was performed
using the standard protocol following the bolus administration of
intravenous contrast.
Contrast: 20mL OMNIPAQUE IOHEXOL 300 MG/ML  SOLN, 100mL OMNIPAQUE
IOHEXOL 300 MG/ML  SOLN

[Series 2: routine pelvis · axial · 0.74mm/px · z∈[-205,-10]mm · 15 of 42 slices shown, 19 images]
[im 3/42  soft-tissue]
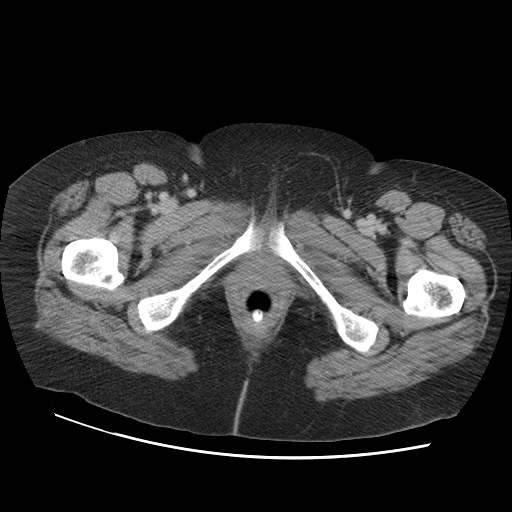
[im 3/42  bone]
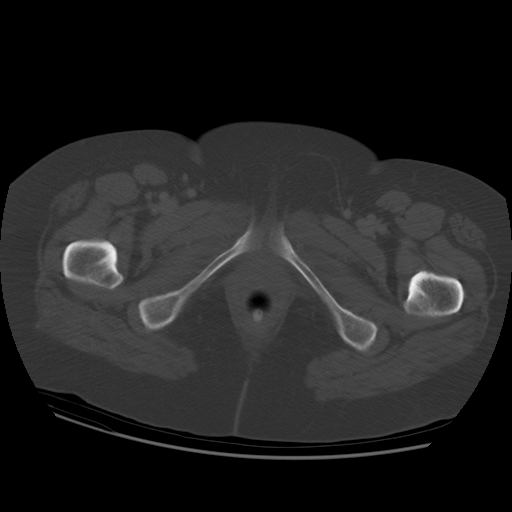
[im 6/42  soft-tissue]
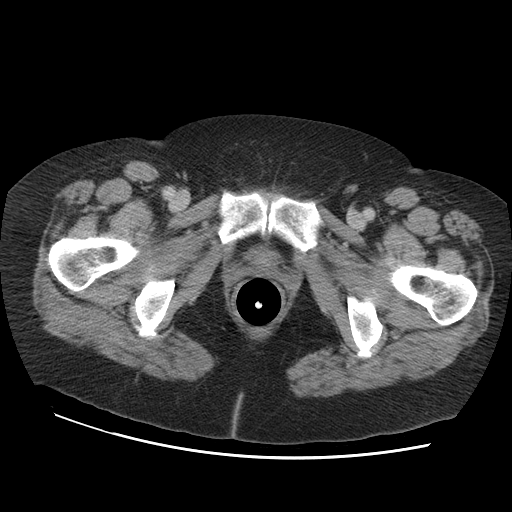
[im 8/42  soft-tissue]
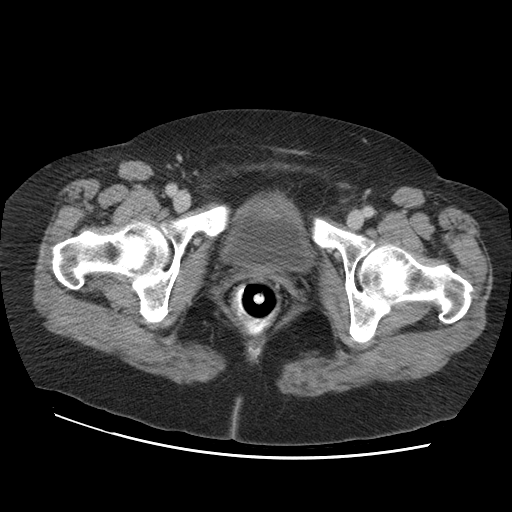
[im 12/42  soft-tissue]
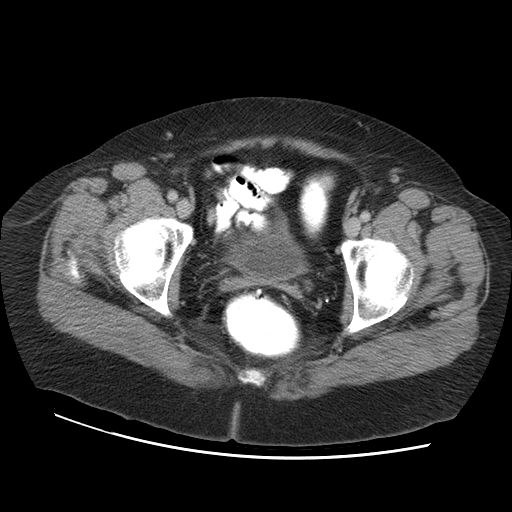
[im 15/42  soft-tissue]
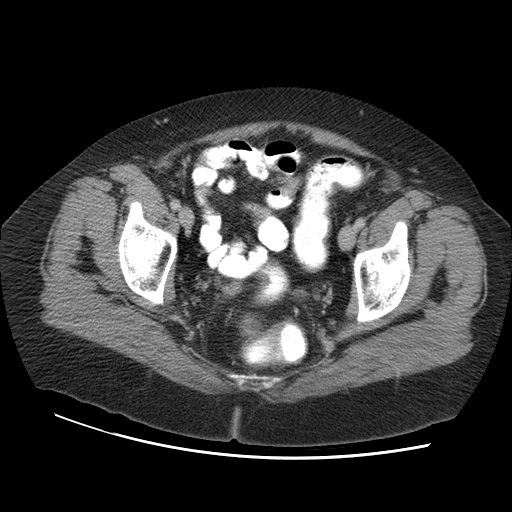
[im 18/42  soft-tissue]
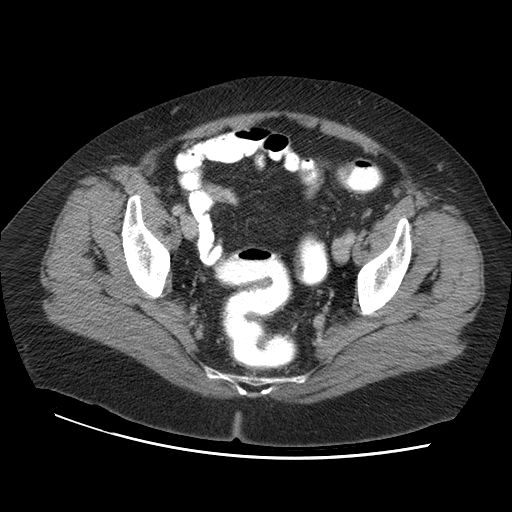
[im 22/42  soft-tissue]
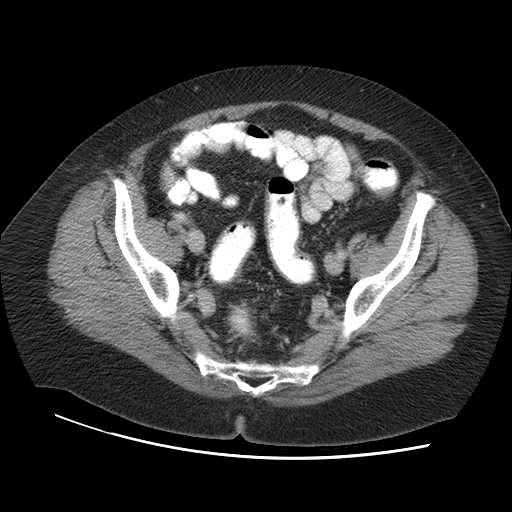
[im 24/42  soft-tissue]
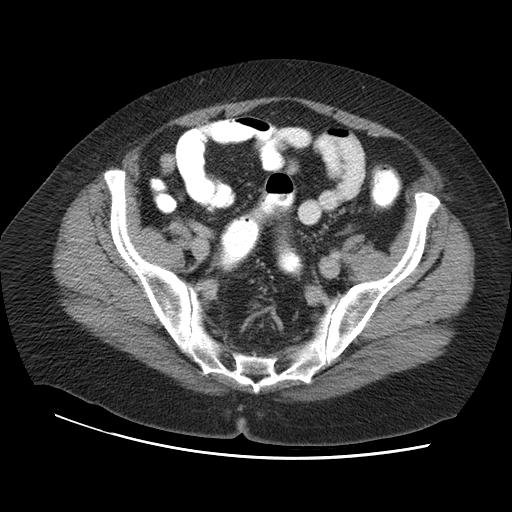
[im 27/42  soft-tissue]
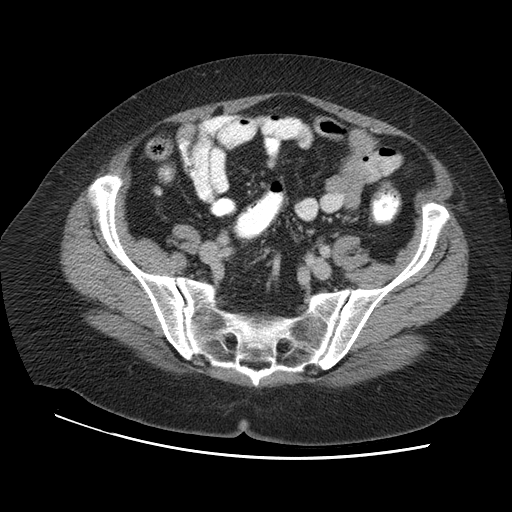
[im 27/42  bone]
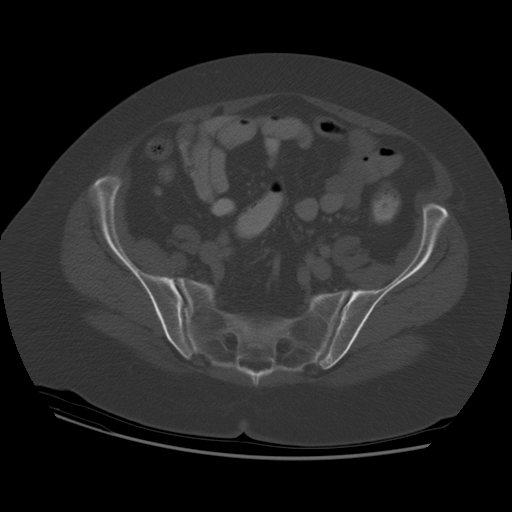
[im 30/42  soft-tissue]
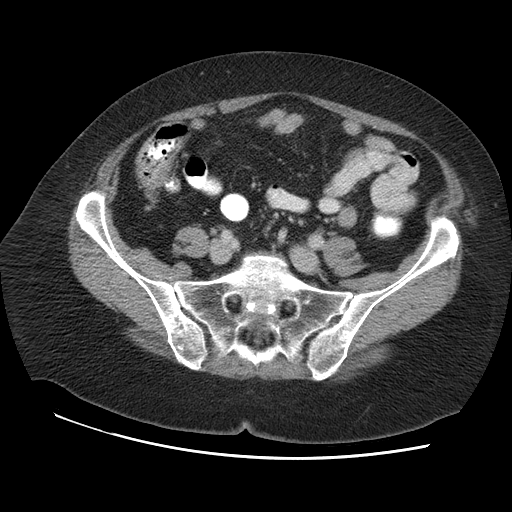
[im 34/42  soft-tissue]
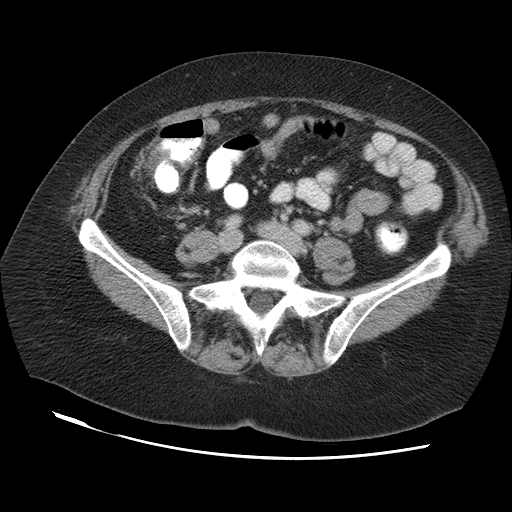
[im 36/42  soft-tissue]
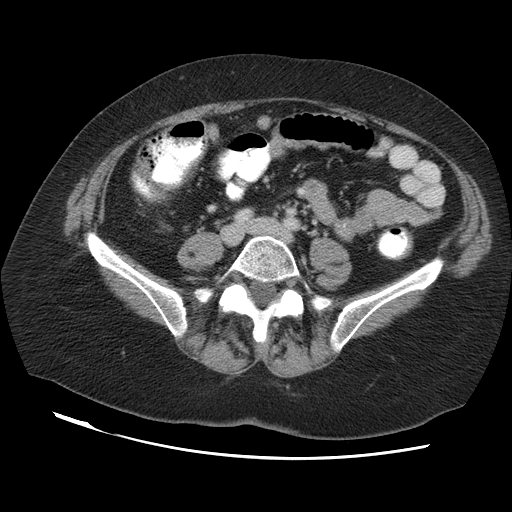
[im 36/42  lung]
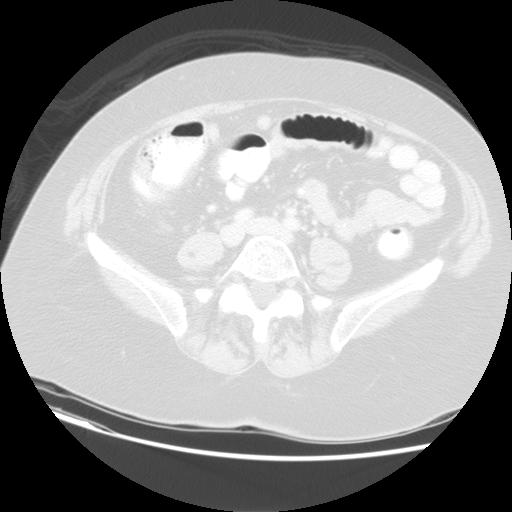
[im 38/42  lung]
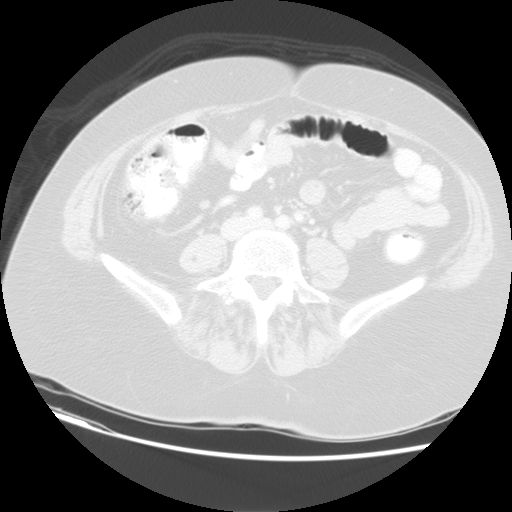
[im 39/42  soft-tissue]
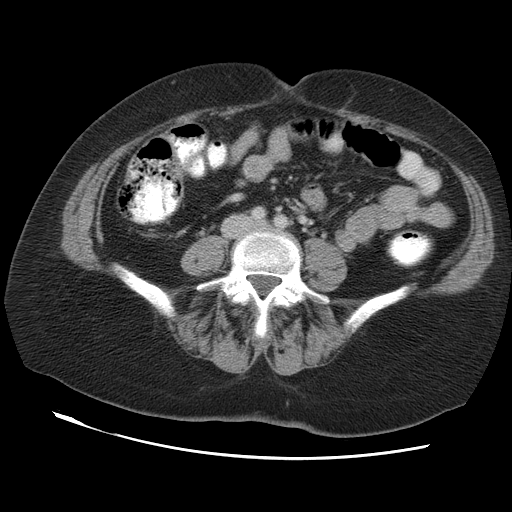
[im 39/42  lung]
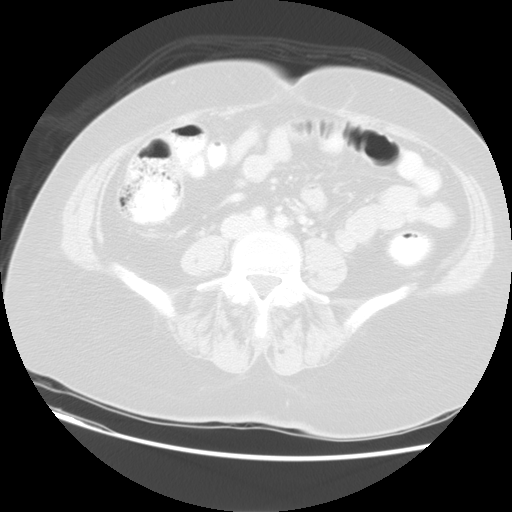
[im 40/42  lung]
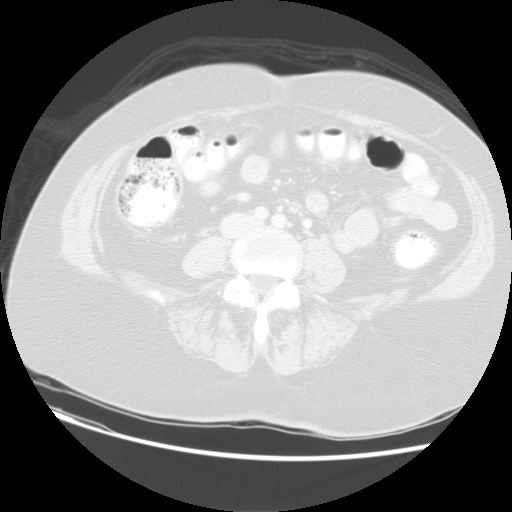

[15 of 36 positions shown; findings below may reference images not displayed]

FINDINGS: The examination was performed with rectal contrast.  The
rectum, sigmoid colon and remainder of the visualized portions of
the colon and small bowel have normal appearance.  No contrast is
seen in the vagina or urinary bladder.  No masses or enlarged lymph
nodes are seen.  The uterus and ovaries are surgically absent. The
previously seen small sclerotic foci in both iliac bones are
unchanged, compatible with bone islands.
IMPRESSION: No acute abnormality.  No rectovaginal fistula seen.

## 2013-03-24 DIAGNOSIS — R059 Cough, unspecified: Secondary | ICD-10-CM | POA: Insufficient documentation

## 2013-03-24 DIAGNOSIS — R05 Cough: Secondary | ICD-10-CM | POA: Insufficient documentation

## 2013-04-05 ENCOUNTER — Encounter: Payer: Self-pay | Admitting: Family Medicine

## 2013-04-05 ENCOUNTER — Ambulatory Visit (INDEPENDENT_AMBULATORY_CARE_PROVIDER_SITE_OTHER): Payer: BC Managed Care – PPO | Admitting: Family Medicine

## 2013-04-05 ENCOUNTER — Encounter (INDEPENDENT_AMBULATORY_CARE_PROVIDER_SITE_OTHER): Payer: Self-pay

## 2013-04-05 VITALS — BP 119/75 | HR 66 | Resp 16 | Ht 65.5 in | Wt 167.0 lb

## 2013-04-05 DIAGNOSIS — R21 Rash and other nonspecific skin eruption: Secondary | ICD-10-CM

## 2013-04-05 DIAGNOSIS — F329 Major depressive disorder, single episode, unspecified: Secondary | ICD-10-CM

## 2013-04-05 DIAGNOSIS — E663 Overweight: Secondary | ICD-10-CM

## 2013-04-05 DIAGNOSIS — F3289 Other specified depressive episodes: Secondary | ICD-10-CM

## 2013-04-05 MED ORDER — ORLISTAT 120 MG PO CAPS: 120.0000 mg | ORAL_CAPSULE | Freq: Three times a day (TID) | ORAL | Status: DC

## 2013-04-05 MED ORDER — CLOBETASOL PROPIONATE EMULSION 0.05 % EX FOAM: 1.0000 "application " | Freq: Two times a day (BID) | CUTANEOUS | Status: DC

## 2013-04-05 MED ORDER — KETOCONAZOLE 2 % EX FOAM: CUTANEOUS | Status: DC

## 2013-04-05 MED ORDER — BUPROPION HCL ER (SR) 200 MG PO TB12: 200.0000 mg | ORAL_TABLET | Freq: Two times a day (BID) | ORAL | Status: AC

## 2013-04-05 MED ORDER — CLOBETASOL PROPIONATE 0.05 % EX FOAM: Freq: Two times a day (BID) | CUTANEOUS | Status: DC

## 2013-04-05 MED ORDER — FLUCONAZOLE 200 MG PO TABS: 200.0000 mg | ORAL_TABLET | Freq: Every day | ORAL | Status: DC

## 2013-04-05 MED ORDER — NYSTATIN 100000 UNIT/GM EX POWD: CUTANEOUS | Status: DC

## 2013-04-05 NOTE — Patient Instructions (Addendum)
1)  Mood/Concentration/Appetite - Let's try the Wellbutrin 200 mg in the am for 6 days then increase to 1 in the am and 1 at lunch.  Do the Wellbutrin only for 2 + weeks.  If you thought you wanted to maybe try the Qsymia again, then I would first take some L-Tyrosine for maybe 2 weeks before re-challenging yourself with the Qsymia.  You could try alternating the Wellbutrin with the Qsymia or just reserve the Qsymia for the weekend.  Another option would be to also try some Xenical 1 capsule before each meal.    2)  Rash -   Anti-Fungal - Extina then use the Nystatin Powder to keep the yeast down.  If it returns you might try Diflucan for yeast.    Dermatis - Olux vs Olux E    Eczema Eczema, also called atopic dermatitis, is a skin disorder that causes inflammation of the skin. It causes a red rash and dry, scaly skin. The skin becomes very itchy. Eczema is generally worse during the cooler winter months and often improves with the warmth of summer. Eczema usually starts showing signs in infancy. Some children outgrow eczema, but it may last through adulthood.  CAUSES  The exact cause of eczema is not known, but it appears to run in families. People with eczema often have a family history of eczema, allergies, asthma, or hay fever. Eczema is not contagious. Flare-ups of the condition may be caused by:   Contact with something you are sensitive or allergic to.   Stress. SIGNS AND SYMPTOMS  Dry, scaly skin.   Red, itchy rash.   Itchiness. This may occur before the skin rash and may be very intense.  DIAGNOSIS  The diagnosis of eczema is usually made based on symptoms and medical history. TREATMENT  Eczema cannot be cured, but symptoms usually can be controlled with treatment and other strategies. A treatment plan might include:  Controlling the itching and scratching.   Use over-the-counter antihistamines as directed for itching. This is especially useful at night when the itching  tends to be worse.   Use over-the-counter steroid creams as directed for itching.   Avoid scratching. Scratching makes the rash and itching worse. It may also result in a skin infection (impetigo) due to a break in the skin caused by scratching.   Keeping the skin well moisturized with creams every day. This will seal in moisture and help prevent dryness. Lotions that contain alcohol and water should be avoided because they can dry the skin.   Limiting exposure to things that you are sensitive or allergic to (allergens).   Recognizing situations that cause stress.   Developing a plan to manage stress.  HOME CARE INSTRUCTIONS   Only take over-the-counter or prescription medicines as directed by your health care provider.   Do not use anything on the skin without checking with your health care provider.   Keep baths or showers short (5 minutes) in warm (not hot) water. Use mild cleansers for bathing. These should be unscented. You may add nonperfumed bath oil to the bath water. It is best to avoid soap and bubble bath.   Immediately after a bath or shower, when the skin is still damp, apply a moisturizing ointment to the entire body. This ointment should be a petroleum ointment. This will seal in moisture and help prevent dryness. The thicker the ointment, the better. These should be unscented.   Keep fingernails cut short. Children with eczema may need to  wear soft gloves or mittens at night after applying an ointment.   Dress in clothes made of cotton or cotton blends. Dress lightly, because heat increases itching.   A child with eczema should stay away from anyone with fever blisters or cold sores. The virus that causes fever blisters (herpes simplex) can cause a serious skin infection in children with eczema. SEEK MEDICAL CARE IF:   Your itching interferes with sleep.   Your rash gets worse or is not better within 1 week after starting treatment.   You see pus or  soft yellow scabs in the rash area.   You have a fever.   You have a rash flare-up after contact with someone who has fever blisters.  Document Released: 02/19/2000 Document Revised: 12/12/2012 Document Reviewed: 09/24/2012 [facility] Patient Information 2014 [place].

## 2013-04-05 NOTE — Progress Notes (Signed)
Subjective:    Patient ID: Ariel Mosley, female    DOB: 12/19/1950, 63 y.o.   MRN: [id number]  HPI  Ariel Mosley is here today to discuss a couple of issues:    1)  Rash - She has been struggling with a rash under her right breast for several years that she feels is a yeast/fungal infection.  She has used various OTC medications in the past which have been ineffective in treating this problem.  She feels that this rash has worsened in the past month and would like to be prescribes something to make it go away.     2)  Weight Loss -  She has been on Qsymia for several months which has helped her to lose some weight.  She has been experiencing several side effects including concentration problems, numbness and constipation so she is not sure that she wants to remain on it.     Review of Systems  Skin: Positive for rash.  Neurological: Positive for numbness.  Psychiatric/Behavioral: Positive for decreased concentration.       Problems with conversations, word retrieval and difficulty speaking clearly.     Past Medical History  Diagnosis Date  . Breast cancer July 2003  . Yeast infection   . Herpes     Genital   . Fibroids   . Breast lump   . History of chicken pox   . History of measles, mumps, or rubella     She had mumps and measles.  . RLS (restless legs syndrome)   . Hyperlipidemia   . Depression   . Gestational diabetes     With her last pregnancy only.    . Renal cyst      Past Surgical History  Procedure Laterality Date  . Breast lumpectomy  09/26/2001    Right   . Axillary lymph node dissection  09/26/2001  . Total abdominal hysterectomy w/ bilateral salpingoophorectomy  2000    Fibroids   . Tonsillectomy    . Wisdom tooth extraction    . Tubal ligation    . Bilateral salpingoophorectomy  2000     History   Social History Narrative   Marital Status: Married Art gallery manager)   Children:  Sons (Suezanne Jacquet, Will)    Pets: Dogs (2)    Living Situation: Lives with husband   Occupation: Oceanographer (Surveyor, mining at [employer])   Education: Forensic psychologist    Tobacco Use/Exposure:  None    Alcohol Use:  Occasional   Drug Use:  None   Diet:  Regular   Exercise:  Walking (4-5 X week)    Hobbies: Chartered certified accountant               Family History  Problem Relation Age of Onset  . Heart disease Mother   . Hyperlipidemia Mother   . Alzheimer's disease Mother   . Heart disease Maternal Grandmother   . Hyperlipidemia Maternal Grandmother   . Cancer Paternal Uncle     Lung     Current Outpatient Prescriptions on File Prior to Visit  Medication Sig Dispense Refill  . MELATONIN PO Take by mouth.      . Phentermine-Topiramate (QSYMIA) 7.5-46 MG CP24 Take 1 tablet by mouth daily before breakfast.  30 capsule  2  . rOPINIRole (REQUIP) 2 MG tablet Take 1/2 - 1 tab po at bedtime  30 tablet  11  . pravastatin (PRAVACHOL) 40 MG tablet Take 1 tablet (40 mg total) by mouth  at bedtime.  90 tablet  3   No current facility-administered medications on file prior to visit.     Allergies  Allergen Reactions  . Penicillins Hives    Per 09/28/2001 New Patient Evaluation ([facility]).     Immunization History  Administered Date(s) Administered  . Tdap 05/18/2007  . Zoster 09/07/2011      Objective:   Physical Exam  Vitals reviewed. Constitutional: She is oriented to person, place, and time.  Eyes: Conjunctivae are normal. No scleral icterus.  Neck: Neck supple. No thyromegaly present.  Cardiovascular: Normal rate, regular rhythm and normal heart sounds.   Pulmonary/Chest: Effort normal and breath sounds normal.    Musculoskeletal: She exhibits no edema and no tenderness.  Lymphadenopathy:    She has no cervical adenopathy.  Neurological: She is alert and oriented to person, place, and time.  Skin: Skin is warm and dry. Rash noted.  Psychiatric: She has a normal mood and affect. Her behavior is normal. Judgment and thought content normal.       Assessment & Plan:    Ariel Mosley was seen today for rash.  Diagnoses and associated orders for this visit:  Rash and nonspecific skin eruption Comments: I am not sure if her rash is fungal vs eczema so she was given medications for both.  If she does not improve, she is to address this condition with her dermatologist (Dr. Tonia Brooms) in [place].    - Ketoconazole 2 % FOAM; Apply to skin BID for 4 weeks - clobetasol (OLUX) 0.05 % topical foam; Apply topically 2 (two) times daily. - Clobetasol Propionate Emulsion 0.05 % topical foam; Apply 1 application topically 2 (two) times daily. - nystatin (MYCOSTATIN/NYSTOP) 100000 UNIT/GM POWD; Apply to skin BID - fluconazole (DIFLUCAN) 200 MG tablet; Take 1 tablet (200 mg total) by mouth daily.  Depressive disorder, not elsewhere classified Comments: Ariel Mosley has done well on Wellbutrin in the past so we will get her back on this medication.   - buPROPion (WELLBUTRIN SR) 200 MG 12 hr tablet; Take 1 tablet (200 mg total) by mouth 2 (two) times daily.  Overweight Comments: We'll see how she does with the combination of Wellbutrin which not only improves her mood but helps her make better food choices and Xenical.    - orlistat (XENICAL) 120 MG capsule; Take 1 capsule (120 mg total) by mouth 3 (three) times daily with meals.   TIME SPENT "FACE TO FACE" WITH PATIENT -  30 MINS

## 2013-06-10 ENCOUNTER — Telehealth: Payer: Self-pay | Admitting: *Deleted

## 2013-06-10 DIAGNOSIS — F3289 Other specified depressive episodes: Secondary | ICD-10-CM | POA: Insufficient documentation

## 2013-06-10 DIAGNOSIS — E663 Overweight: Secondary | ICD-10-CM | POA: Insufficient documentation

## 2013-06-10 DIAGNOSIS — F329 Major depressive disorder, single episode, unspecified: Secondary | ICD-10-CM | POA: Insufficient documentation

## 2013-06-10 DIAGNOSIS — R21 Rash and other nonspecific skin eruption: Secondary | ICD-10-CM | POA: Insufficient documentation

## 2013-06-10 NOTE — Telephone Encounter (Signed)
Called patient to follow up on her rash that she had in her breast.  She said that after taking a lot of the prescriptions given the antifungal cream cleared this rash and she has not had any new oubreak since it resolved.  PG

## 2013-06-14 ENCOUNTER — Encounter: Payer: Self-pay | Admitting: Family Medicine

## 2013-08-26 ENCOUNTER — Other Ambulatory Visit: Payer: Self-pay | Admitting: *Deleted

## 2013-08-26 MED ORDER — ROPINIROLE HCL 2 MG PO TABS: ORAL_TABLET | ORAL | Status: DC

## 2013-08-30 ENCOUNTER — Ambulatory Visit (INDEPENDENT_AMBULATORY_CARE_PROVIDER_SITE_OTHER): Payer: BC Managed Care – PPO | Admitting: Family Medicine

## 2013-08-30 ENCOUNTER — Encounter: Payer: Self-pay | Admitting: Family Medicine

## 2013-08-30 VITALS — BP 122/69 | HR 58 | Resp 16 | Ht 65.5 in | Wt 178.0 lb

## 2013-08-30 DIAGNOSIS — F329 Major depressive disorder, single episode, unspecified: Secondary | ICD-10-CM

## 2013-08-30 DIAGNOSIS — F3289 Other specified depressive episodes: Secondary | ICD-10-CM

## 2013-08-30 DIAGNOSIS — G2581 Restless legs syndrome: Secondary | ICD-10-CM

## 2013-08-30 DIAGNOSIS — E785 Hyperlipidemia, unspecified: Secondary | ICD-10-CM

## 2013-08-30 DIAGNOSIS — R5381 Other malaise: Secondary | ICD-10-CM

## 2013-08-30 DIAGNOSIS — R1319 Other dysphagia: Secondary | ICD-10-CM

## 2013-08-30 DIAGNOSIS — Z5181 Encounter for therapeutic drug level monitoring: Secondary | ICD-10-CM

## 2013-08-30 DIAGNOSIS — R5383 Other fatigue: Secondary | ICD-10-CM

## 2013-08-30 DIAGNOSIS — E663 Overweight: Secondary | ICD-10-CM

## 2013-08-30 LAB — CBC WITH DIFFERENTIAL/PLATELET
Basophils Absolute: 0 10*3/uL (ref 0.0–0.1)
Basophils Relative: 0 % (ref 0–1)
Eosinophils Absolute: 0.1 10*3/uL (ref 0.0–0.7)
Eosinophils Relative: 2 % (ref 0–5)
HCT: 37.9 % (ref 36.0–46.0)
Hemoglobin: 13.3 g/dL (ref 12.0–15.0)
Lymphocytes Relative: 41 % (ref 12–46)
Lymphs Abs: 1.7 10*3/uL (ref 0.7–4.0)
MCH: 31.2 pg (ref 26.0–34.0)
MCHC: 35.1 g/dL (ref 30.0–36.0)
MCV: 89 fL (ref 78.0–100.0)
Monocytes Absolute: 0.3 10*3/uL (ref 0.1–1.0)
Monocytes Relative: 8 % (ref 3–12)
Neutro Abs: 2.1 10*3/uL (ref 1.7–7.7)
Neutrophils Relative %: 49 % (ref 43–77)
Platelets: 256 10*3/uL (ref 150–400)
RBC: 4.26 MIL/uL (ref 3.87–5.11)
RDW: 13 % (ref 11.5–15.5)
WBC: 4.2 10*3/uL (ref 4.0–10.5)

## 2013-08-30 LAB — LIPID PANEL
Cholesterol: 260 mg/dL — ABNORMAL HIGH (ref 0–200)
HDL: 86 mg/dL (ref >39)
LDL Cholesterol: 164 mg/dL — ABNORMAL HIGH (ref 0–99)
Total CHOL/HDL Ratio: 3 Ratio
Triglycerides: 51 mg/dL (ref <150)
VLDL: 10 mg/dL (ref 0–40)

## 2013-08-30 LAB — COMPLETE METABOLIC PANEL WITH GFR
ALT: 14 U/L (ref 0–35)
AST: 18 U/L (ref 0–37)
Albumin: 4.3 g/dL (ref 3.5–5.2)
Alkaline Phosphatase: 74 U/L (ref 39–117)
BUN: 12 mg/dL (ref 6–23)
CO2: 26 mEq/L (ref 19–32)
Calcium: 9.2 mg/dL (ref 8.4–10.5)
Chloride: 104 mEq/L (ref 96–112)
Creat: 0.78 mg/dL (ref 0.50–1.10)
GFR, Est African American: >89 mL/min
GFR, Est Non African American: 82 mL/min
Glucose, Bld: 92 mg/dL (ref 70–99)
Potassium: 4.4 mEq/L (ref 3.5–5.3)
Sodium: 138 mEq/L (ref 135–145)
Total Bilirubin: 0.6 mg/dL (ref 0.2–1.2)
Total Protein: 6.8 g/dL (ref 6.0–8.3)

## 2013-08-30 LAB — TSH: TSH: 1.624 u[IU]/mL (ref 0.350–4.500)

## 2013-08-30 MED ORDER — NALTREXONE-BUPROPION HCL ER 8-90 MG PO TB12: 2.0000 | ORAL_TABLET | Freq: Two times a day (BID) | ORAL | Status: AC

## 2013-08-30 MED ORDER — RABEPRAZOLE SODIUM 20 MG PO TBEC: 20.0000 mg | DELAYED_RELEASE_TABLET | Freq: Every day | ORAL | Status: DC

## 2013-08-30 MED ORDER — GABAPENTIN 600 MG PO TABS: 600.0000 mg | ORAL_TABLET | Freq: Every day | ORAL | Status: DC

## 2013-08-30 MED ORDER — BUPROPION HCL ER (XL) 300 MG PO TB24: 300.0000 mg | ORAL_TABLET | Freq: Every day | ORAL | Status: DC

## 2013-08-30 NOTE — Progress Notes (Signed)
Subjective:    Patient ID: Ariel Mosley, female    DOB: 10-26-1950, 63 y.o.   MRN: [id number]  HPI  Ariel Mosley is here today to get medication refills and to discuss the following health conditions:    1)  Mood:  She is taking Wellbutrin when she can remember it. She is needing a refill.  2)  Restless Leg Syndrome:  She is not convinced that Requip is working that well for her and she would like to try gabapentin again for this problem.    3)  GI Problems:  She feels like food is getting stuck in the upper area of her chest when she eats. She has not taken any medication to help this.     Review of Systems  Constitutional: Negative for activity change, appetite change and fatigue.  Cardiovascular: Negative for chest pain, palpitations and leg swelling.  Gastrointestinal: Negative for abdominal pain, constipation and blood in stool.       Dysphagia   Endocrine: Negative for polydipsia, polyphagia and polyuria.  Neurological: Negative for headaches.       Restless Leg Symptoms  Psychiatric/Behavioral: Negative for behavioral problems. The patient is not nervous/anxious.   All other systems reviewed and are negative.    Past Medical History  Diagnosis Date  . Breast cancer July 2003  . Yeast infection   . Herpes     Genital   . Fibroids   . Breast lump   . History of chicken pox   . History of measles, mumps, or rubella     She had mumps and measles.  . RLS (restless legs syndrome)   . Hyperlipidemia   . Depression   . Gestational diabetes     With her last pregnancy only.    . Renal cyst      Past Surgical History  Procedure Laterality Date  . Breast lumpectomy  09/26/2001    Right   . Axillary lymph node dissection  09/26/2001  . Total abdominal hysterectomy w/ bilateral salpingoophorectomy  2000    Fibroids   . Tonsillectomy    . Wisdom tooth extraction    . Tubal ligation    . Bilateral salpingoophorectomy  2000     History   Social History Narrative   Marital Status: Married Art gallery manager)   Children:  Sons (Ariel Mosley, Ariel Mosley)    Pets: Dogs (2)    Living Situation: Lives with husband   Occupation: Oceanographer (Surveyor, mining at [employer])   Education: Forensic psychologist    Tobacco Use/Exposure:  None    Alcohol Use:  Occasional   Drug Use:  None   Diet:  Regular   Exercise:  Walking (4-5 X week)    Hobbies: Chartered certified accountant               Family History  Problem Relation Age of Onset  . Heart disease Mother   . Hyperlipidemia Mother   . Alzheimer's disease Mother   . Heart disease Maternal Grandmother   . Hyperlipidemia Maternal Grandmother   . Lung cancer Paternal Uncle      Current Outpatient Prescriptions on File Prior to Visit  Medication Sig Dispense Refill  . buPROPion (WELLBUTRIN SR) 200 MG 12 hr tablet Take 1 tablet (200 mg total) by mouth 2 (two) times daily.  60 tablet  5  . MELATONIN PO Take by mouth.       No current facility-administered medications on file prior to visit.  Allergies  Allergen Reactions  . Penicillins Hives    Per 09/28/2001 New Patient Evaluation ([facility]).     Immunization History  Administered Date(s) Administered  . Tdap 05/18/2007  . Zoster 09/07/2011       Objective:   Physical Exam  Vitals reviewed. Constitutional: She is oriented to person, place, and time. She appears well-nourished.  HENT:  Head: Normocephalic.  Eyes: Pupils are equal, round, and reactive to light.  Pulmonary/Chest: Effort normal.  Abdominal: Soft. She exhibits no distension and no mass. There is tenderness. There is no rebound and no guarding.  Neurological: She is alert and oriented to person, place, and time.  Skin: Skin is warm and dry.  Psychiatric: She has a normal mood and affect. Her behavior is normal. Judgment and thought content normal.      Assessment & Plan:    [name] was seen today for medication management.  Diagnoses and associated orders for this visit:  Restless  leg syndrome - gabapentin (NEURONTIN) 600 MG tablet; Take 1 tablet (600 mg total) by mouth at bedtime.  Overweight - Naltrexone-Bupropion HCl ER (CONTRAVE) 8-90 MG TB12; Take 2 tablets by mouth 2 (two) times daily.  Other dysphagia - RABEprazole (ACIPHEX) 20 MG tablet; Take 1 tablet (20 mg total) by mouth daily.  Depressive disorder, not elsewhere classified - buPROPion (WELLBUTRIN XL) 300 MG 24 hr tablet; Take 1 tablet (300 mg total) by mouth daily.  Other and unspecified hyperlipidemia - Lipid panel  Other malaise and fatigue - TSH  Encounter for therapeutic drug monitoring - CBC w/Diff - COMPLETE METABOLIC PANEL WITH GFR   TIME SPENT "FACE TO FACE" WITH PATIENT -  30 MINS

## 2013-09-02 ENCOUNTER — Encounter: Payer: Self-pay | Admitting: *Deleted

## 2013-09-11 ENCOUNTER — Ambulatory Visit (INDEPENDENT_AMBULATORY_CARE_PROVIDER_SITE_OTHER): Payer: BC Managed Care – PPO | Admitting: Family Medicine

## 2013-09-11 ENCOUNTER — Encounter: Payer: Self-pay | Admitting: Family Medicine

## 2013-09-11 VITALS — BP 135/83 | HR 52 | Resp 16 | Wt 177.0 lb

## 2013-09-11 DIAGNOSIS — R110 Nausea: Secondary | ICD-10-CM

## 2013-09-11 DIAGNOSIS — E785 Hyperlipidemia, unspecified: Secondary | ICD-10-CM

## 2013-09-11 DIAGNOSIS — G2581 Restless legs syndrome: Secondary | ICD-10-CM

## 2013-09-11 MED ORDER — ROSUVASTATIN CALCIUM 20 MG PO TABS: 20.0000 mg | ORAL_TABLET | Freq: Every day | ORAL | Status: DC

## 2013-09-11 MED ORDER — PROMETHAZINE HCL 25 MG PO TABS: ORAL_TABLET | ORAL | Status: DC

## 2013-09-11 MED ORDER — ROPINIROLE HCL 2 MG PO TABS: ORAL_TABLET | ORAL | Status: AC

## 2013-09-11 NOTE — Patient Instructions (Addendum)
1)  Cholesterol - Start on Co Q 10 100-200 mg to prevent symptoms that you might experience with a statin. Start on 5 mg of Crestor and then increase to 10 mg.  We'll recheck your level in 4 -5 months.    2)  RLS - Go back on the Requip with 6.25 mg - 12.5 mg of the Phenergan at bedtime for nausea/sleep (ICE WATER).    3)  Weight - Go back on the Wellbutrin if the Contrave is not worth the expense.

## 2013-09-11 NOTE — Progress Notes (Signed)
Subjective:    Patient ID: Ariel Mosley, female    DOB: 1951/03/04, 63 y.o.   MRN: [id number]  HPI  Ariel Mosley is here today to follow up on her recent lab results and to discuss the following health issues:  1)  Weight - She tried the Contrave in place of the Wellbutrin and did not really notice a big change in her appetite.  Her insurance does not cover it.    2)  Restless Leg - The gabapentin really did not do much for her so she went back on Requip and needs a refill.     Review of Systems  Constitutional: Negative for activity change, appetite change and fatigue.  Cardiovascular: Negative for chest pain, palpitations and leg swelling.  Psychiatric/Behavioral: Negative for behavioral problems. The patient is not nervous/anxious.   All other systems reviewed and are negative.    Past Medical History  Diagnosis Date  . Breast cancer July 2003  . Yeast infection   . Herpes     Genital   . Fibroids   . Breast lump   . History of chicken pox   . History of measles, mumps, or rubella     She had mumps and measles.  . RLS (restless legs syndrome)   . Hyperlipidemia   . Depression   . Gestational diabetes     With her last pregnancy only.    . Renal cyst      Past Surgical History  Procedure Laterality Date  . Breast lumpectomy  09/26/2001    Right   . Axillary lymph node dissection  09/26/2001  . Total abdominal hysterectomy w/ bilateral salpingoophorectomy  2000    Fibroids   . Tonsillectomy    . Wisdom tooth extraction    . Tubal ligation    . 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 Heart disease Mother   . Hyperlipidemia Mother   . Alzheimer's disease Mother   . Heart disease Maternal Grandmother   . Hyperlipidemia Maternal Grandmother   . Lung cancer Paternal Uncle      Current Outpatient Prescriptions on File Prior to Visit  Medication Sig Dispense Refill  . MELATONIN PO Take by mouth.      . Naltrexone-Bupropion HCl ER (CONTRAVE) 8-90 MG TB12 Take 2 tablets by mouth 2 (two) times daily.  120 tablet  5  . RABEprazole (ACIPHEX) 20 MG tablet Take 1 tablet (20 mg total) by mouth daily.  30 tablet  11  . buPROPion (WELLBUTRIN SR) 200 MG 12 hr tablet Take 1 tablet (200 mg total) by mouth 2 (two) times daily.  60 tablet  5   No current facility-administered medications on file prior to visit.     Allergies  Allergen Reactions  . Penicillins Hives    Per 09/28/2001 New Patient Evaluation ([facility]).  Immunization History  Administered Date(s) Administered  . Tdap 05/18/2007  . Zoster 09/07/2011       Objective:   Physical Exam  Vitals reviewed. Constitutional: She is oriented to person, place, and time. She appears well-nourished.  HENT:  Head: Normocephalic.  Eyes: Pupils are equal, round, and reactive to light.  Pulmonary/Chest: Effort normal.  Abdominal: Soft.  Neurological: She is alert and oriented to person, place, and time.  Skin: Skin is warm and dry.  Psychiatric: She has a normal mood and affect. Her behavior is normal. Judgment and thought content normal.      Assessment & Plan:    Ariel Mosley was seen today for follow-up.  Diagnoses and associated orders for this visit:  Other and unspecified hyperlipidemia - rosuvastatin (CRESTOR) 20 MG tablet; Take 1 tablet (20 mg total) by mouth daily.  Restless leg syndrome - rOPINIRole (REQUIP) 2 MG tablet; Take 1/2 - 1 tab po at bedtime  Nausea alone Comments: She feels that  the Requip makes her a little nauseated so she'll take some Phenergan if needed.   - promethazine (PHENERGAN) 25 MG tablet; Take 1/4 - 1 tab up to 4 x per day as needed for nausea

## 2013-09-18 ENCOUNTER — Encounter: Payer: Self-pay | Admitting: Internal Medicine

## 2013-10-24 DIAGNOSIS — G2581 Restless legs syndrome: Secondary | ICD-10-CM | POA: Insufficient documentation

## 2013-10-24 DIAGNOSIS — E785 Hyperlipidemia, unspecified: Secondary | ICD-10-CM | POA: Insufficient documentation

## 2013-10-24 HISTORY — DX: Restless legs syndrome: G25.81

## 2014-01-06 ENCOUNTER — Encounter: Payer: Self-pay | Admitting: Family Medicine

## 2014-05-01 ENCOUNTER — Other Ambulatory Visit: Payer: Self-pay | Admitting: Nurse Practitioner

## 2014-05-14 ENCOUNTER — Encounter: Payer: Self-pay | Admitting: Internal Medicine

## 2014-05-29 ENCOUNTER — Telehealth: Payer: Self-pay | Admitting: Internal Medicine

## 2014-05-29 NOTE — Telephone Encounter (Signed)
Pt received our Recall Colon Letter in the mail and call office to advice [place] that she has transferred her GI Care to another practice.

## 2019-04-13 ENCOUNTER — Ambulatory Visit: Payer: Medicare PPO | Attending: Internal Medicine

## 2019-04-13 DIAGNOSIS — Z23 Encounter for immunization: Secondary | ICD-10-CM | POA: Insufficient documentation

## 2019-04-13 NOTE — Progress Notes (Signed)
   Covid-19 Vaccination Clinic  Name:  Ariel Mosley    MRN: [id number] DOB: [birth year]  04/13/2019  [name] was observed post Covid-19 immunization for 15 minutes without incidence. She was provided with Vaccine Information Sheet and instruction to access the V-Safe system.   [name] was instructed to call 911 with any severe reactions post vaccine: Marland Kitchen Difficulty breathing  . Swelling of your face and throat  . A fast heartbeat  . A bad rash all over your body  . Dizziness and weakness    Immunizations Administered    Name Date Dose VIS Date Route   Pfizer COVID-19 Vaccine 04/13/2019  4:05 PM 0.3 mL 02/15/2019 Intramuscular   Manufacturer: [place]   Lot: [id number]   [place]: [id number]

## 2019-05-01 ENCOUNTER — Ambulatory Visit: Payer: BC Managed Care – PPO

## 2019-05-08 ENCOUNTER — Ambulatory Visit: Payer: Medicare PPO | Attending: Internal Medicine

## 2019-05-08 DIAGNOSIS — Z23 Encounter for immunization: Secondary | ICD-10-CM | POA: Insufficient documentation

## 2019-05-08 NOTE — Progress Notes (Signed)
   Covid-19 Vaccination Clinic  Name:  Ariel Mosley    MRN: [id number] DOB: Jun 12, 1950  05/08/2019  [name] was observed post Covid-19 immunization for 15 minutes without incident. She was provided with Vaccine Information Sheet and instruction to access the V-Safe system.   [name] was instructed to call 911 with any severe reactions post vaccine: Marland Kitchen Difficulty breathing  . Swelling of face and throat  . A fast heartbeat  . A bad rash all over body  . Dizziness and weakness   Immunizations Administered    Name Date Dose VIS Date Route   Pfizer COVID-19 Vaccine 05/08/2019 12:13 PM 0.3 mL 02/15/2019 Intramuscular   Manufacturer: [place]   Lot: [id number]   [place]: [id number]

## 2019-11-16 ENCOUNTER — Other Ambulatory Visit: Payer: Self-pay

## 2019-11-16 ENCOUNTER — Ambulatory Visit: Payer: Medicare PPO | Attending: Internal Medicine

## 2019-11-16 DIAGNOSIS — Z23 Encounter for immunization: Secondary | ICD-10-CM

## 2019-11-16 NOTE — Progress Notes (Signed)
   Covid-19 Vaccination Clinic  Name:  Ariel Mosley    MRN: [id number] DOB: 1950-11-23  11/16/2019  [name] was observed post Covid-19 immunization for 15 minutes without incident. She was provided with Vaccine Information Sheet and instruction to access the V-Safe system.   [name] was instructed to call 911 with any severe reactions post vaccine: Marland Kitchen Difficulty breathing  . Swelling of face and throat  . A fast heartbeat  . A bad rash all over body  . Dizziness and weakness

## 2021-05-14 DIAGNOSIS — R051 Acute cough: Secondary | ICD-10-CM | POA: Diagnosis not present

## 2021-05-14 DIAGNOSIS — U071 COVID-19: Secondary | ICD-10-CM | POA: Diagnosis not present

## 2021-06-18 DIAGNOSIS — J0140 Acute pansinusitis, unspecified: Secondary | ICD-10-CM | POA: Diagnosis not present

## 2021-06-18 DIAGNOSIS — R0981 Nasal congestion: Secondary | ICD-10-CM | POA: Diagnosis not present

## 2021-08-23 DIAGNOSIS — D2261 Melanocytic nevi of right upper limb, including shoulder: Secondary | ICD-10-CM | POA: Diagnosis not present

## 2021-08-23 DIAGNOSIS — L821 Other seborrheic keratosis: Secondary | ICD-10-CM | POA: Diagnosis not present

## 2021-08-23 DIAGNOSIS — H2513 Age-related nuclear cataract, bilateral: Secondary | ICD-10-CM | POA: Diagnosis not present

## 2021-08-23 DIAGNOSIS — H524 Presbyopia: Secondary | ICD-10-CM | POA: Diagnosis not present

## 2021-08-23 DIAGNOSIS — D225 Melanocytic nevi of trunk: Secondary | ICD-10-CM | POA: Diagnosis not present

## 2021-08-23 DIAGNOSIS — L814 Other melanin hyperpigmentation: Secondary | ICD-10-CM | POA: Diagnosis not present

## 2021-08-23 DIAGNOSIS — D2271 Melanocytic nevi of right lower limb, including hip: Secondary | ICD-10-CM | POA: Diagnosis not present

## 2022-02-06 DIAGNOSIS — R0981 Nasal congestion: Secondary | ICD-10-CM | POA: Diagnosis not present

## 2022-02-06 DIAGNOSIS — R051 Acute cough: Secondary | ICD-10-CM | POA: Diagnosis not present

## 2022-02-06 DIAGNOSIS — J0140 Acute pansinusitis, unspecified: Secondary | ICD-10-CM | POA: Diagnosis not present

## 2022-02-06 DIAGNOSIS — Z6824 Body mass index (BMI) 24.0-24.9, adult: Secondary | ICD-10-CM | POA: Diagnosis not present

## 2022-02-18 DIAGNOSIS — Z6824 Body mass index (BMI) 24.0-24.9, adult: Secondary | ICD-10-CM | POA: Diagnosis not present

## 2022-02-18 DIAGNOSIS — J0100 Acute maxillary sinusitis, unspecified: Secondary | ICD-10-CM | POA: Diagnosis not present

## 2022-02-18 DIAGNOSIS — J210 Acute bronchiolitis due to respiratory syncytial virus: Secondary | ICD-10-CM | POA: Diagnosis not present

## 2022-02-18 DIAGNOSIS — Z03818 Encounter for observation for suspected exposure to other biological agents ruled out: Secondary | ICD-10-CM | POA: Diagnosis not present

## 2022-02-18 DIAGNOSIS — J069 Acute upper respiratory infection, unspecified: Secondary | ICD-10-CM | POA: Diagnosis not present

## 2022-03-11 DIAGNOSIS — Z1231 Encounter for screening mammogram for malignant neoplasm of breast: Secondary | ICD-10-CM | POA: Diagnosis not present

## 2022-03-11 LAB — HM MAMMOGRAPHY

## 2022-04-06 DIAGNOSIS — U071 COVID-19: Secondary | ICD-10-CM | POA: Diagnosis not present

## 2022-04-06 DIAGNOSIS — Z6824 Body mass index (BMI) 24.0-24.9, adult: Secondary | ICD-10-CM | POA: Diagnosis not present

## 2022-04-06 DIAGNOSIS — R059 Cough, unspecified: Secondary | ICD-10-CM | POA: Diagnosis not present

## 2022-05-30 DIAGNOSIS — Z0000 Encounter for general adult medical examination without abnormal findings: Secondary | ICD-10-CM | POA: Diagnosis not present

## 2022-05-30 DIAGNOSIS — E782 Mixed hyperlipidemia: Secondary | ICD-10-CM | POA: Diagnosis not present

## 2022-05-30 DIAGNOSIS — Z6825 Body mass index (BMI) 25.0-25.9, adult: Secondary | ICD-10-CM | POA: Diagnosis not present

## 2022-05-30 DIAGNOSIS — Z1159 Encounter for screening for other viral diseases: Secondary | ICD-10-CM | POA: Diagnosis not present

## 2022-06-06 DIAGNOSIS — M8589 Other specified disorders of bone density and structure, multiple sites: Secondary | ICD-10-CM | POA: Diagnosis not present

## 2022-06-06 DIAGNOSIS — M810 Age-related osteoporosis without current pathological fracture: Secondary | ICD-10-CM | POA: Diagnosis not present

## 2022-08-08 DIAGNOSIS — Z6824 Body mass index (BMI) 24.0-24.9, adult: Secondary | ICD-10-CM | POA: Diagnosis not present

## 2022-08-08 DIAGNOSIS — M810 Age-related osteoporosis without current pathological fracture: Secondary | ICD-10-CM | POA: Diagnosis not present

## 2022-09-26 DIAGNOSIS — H524 Presbyopia: Secondary | ICD-10-CM | POA: Diagnosis not present

## 2022-09-26 DIAGNOSIS — H2513 Age-related nuclear cataract, bilateral: Secondary | ICD-10-CM | POA: Diagnosis not present

## 2022-10-25 DIAGNOSIS — L814 Other melanin hyperpigmentation: Secondary | ICD-10-CM | POA: Diagnosis not present

## 2022-10-25 DIAGNOSIS — D2261 Melanocytic nevi of right upper limb, including shoulder: Secondary | ICD-10-CM | POA: Diagnosis not present

## 2022-10-25 DIAGNOSIS — D225 Melanocytic nevi of trunk: Secondary | ICD-10-CM | POA: Diagnosis not present

## 2022-10-25 DIAGNOSIS — L821 Other seborrheic keratosis: Secondary | ICD-10-CM | POA: Diagnosis not present

## 2022-10-25 DIAGNOSIS — D2271 Melanocytic nevi of right lower limb, including hip: Secondary | ICD-10-CM | POA: Diagnosis not present

## 2022-12-21 DIAGNOSIS — E559 Vitamin D deficiency, unspecified: Secondary | ICD-10-CM | POA: Diagnosis not present

## 2022-12-21 DIAGNOSIS — M40209 Unspecified kyphosis, site unspecified: Secondary | ICD-10-CM | POA: Diagnosis not present

## 2022-12-21 DIAGNOSIS — Z6822 Body mass index (BMI) 22.0-22.9, adult: Secondary | ICD-10-CM | POA: Diagnosis not present

## 2022-12-21 DIAGNOSIS — G479 Sleep disorder, unspecified: Secondary | ICD-10-CM | POA: Diagnosis not present

## 2022-12-21 DIAGNOSIS — F418 Other specified anxiety disorders: Secondary | ICD-10-CM | POA: Diagnosis not present

## 2022-12-27 DIAGNOSIS — L821 Other seborrheic keratosis: Secondary | ICD-10-CM | POA: Diagnosis not present

## 2023-01-11 DIAGNOSIS — M546 Pain in thoracic spine: Secondary | ICD-10-CM | POA: Diagnosis not present

## 2023-01-18 DIAGNOSIS — M546 Pain in thoracic spine: Secondary | ICD-10-CM | POA: Diagnosis not present

## 2023-01-25 DIAGNOSIS — M546 Pain in thoracic spine: Secondary | ICD-10-CM | POA: Diagnosis not present

## 2023-02-08 DIAGNOSIS — M546 Pain in thoracic spine: Secondary | ICD-10-CM | POA: Diagnosis not present

## 2023-02-15 ENCOUNTER — Other Ambulatory Visit (HOSPITAL_COMMUNITY): Payer: Self-pay | Admitting: Family Medicine

## 2023-02-15 DIAGNOSIS — M810 Age-related osteoporosis without current pathological fracture: Secondary | ICD-10-CM | POA: Diagnosis not present

## 2023-02-15 DIAGNOSIS — F325 Major depressive disorder, single episode, in full remission: Secondary | ICD-10-CM | POA: Diagnosis not present

## 2023-02-15 DIAGNOSIS — M546 Pain in thoracic spine: Secondary | ICD-10-CM | POA: Diagnosis not present

## 2023-02-15 DIAGNOSIS — E782 Mixed hyperlipidemia: Secondary | ICD-10-CM | POA: Diagnosis not present

## 2023-02-15 DIAGNOSIS — Z6822 Body mass index (BMI) 22.0-22.9, adult: Secondary | ICD-10-CM | POA: Diagnosis not present

## 2023-03-06 DIAGNOSIS — J09X2 Influenza due to identified novel influenza A virus with other respiratory manifestations: Secondary | ICD-10-CM | POA: Diagnosis not present

## 2023-03-06 DIAGNOSIS — Z6821 Body mass index (BMI) 21.0-21.9, adult: Secondary | ICD-10-CM | POA: Diagnosis not present

## 2023-03-06 DIAGNOSIS — R0981 Nasal congestion: Secondary | ICD-10-CM | POA: Diagnosis not present

## 2023-03-10 DIAGNOSIS — M546 Pain in thoracic spine: Secondary | ICD-10-CM | POA: Diagnosis not present

## 2023-03-11 DIAGNOSIS — Z6820 Body mass index (BMI) 20.0-20.9, adult: Secondary | ICD-10-CM | POA: Diagnosis not present

## 2023-03-11 DIAGNOSIS — J0140 Acute pansinusitis, unspecified: Secondary | ICD-10-CM | POA: Diagnosis not present

## 2023-03-13 ENCOUNTER — Ambulatory Visit (HOSPITAL_COMMUNITY)
Admission: RE | Admit: 2023-03-13 | Discharge: 2023-03-13 | Disposition: A | Discharge status: Home / Self Care | Payer: Self-pay | Source: Ambulatory Visit | Attending: Family Medicine | Admitting: Family Medicine

## 2023-03-13 DIAGNOSIS — E782 Mixed hyperlipidemia: Secondary | ICD-10-CM | POA: Insufficient documentation

## 2023-03-16 DIAGNOSIS — M546 Pain in thoracic spine: Secondary | ICD-10-CM | POA: Diagnosis not present

## 2023-03-24 DIAGNOSIS — Z1231 Encounter for screening mammogram for malignant neoplasm of breast: Secondary | ICD-10-CM | POA: Diagnosis not present

## 2023-03-24 LAB — HM MAMMOGRAPHY

## 2023-03-27 DIAGNOSIS — M546 Pain in thoracic spine: Secondary | ICD-10-CM | POA: Diagnosis not present

## 2023-05-25 DIAGNOSIS — R911 Solitary pulmonary nodule: Secondary | ICD-10-CM | POA: Diagnosis not present

## 2023-05-25 DIAGNOSIS — M810 Age-related osteoporosis without current pathological fracture: Secondary | ICD-10-CM | POA: Diagnosis not present

## 2023-05-25 DIAGNOSIS — E559 Vitamin D deficiency, unspecified: Secondary | ICD-10-CM | POA: Diagnosis not present

## 2023-05-26 ENCOUNTER — Other Ambulatory Visit: Payer: Self-pay | Admitting: Family Medicine

## 2023-05-26 DIAGNOSIS — R911 Solitary pulmonary nodule: Secondary | ICD-10-CM

## 2023-05-29 DIAGNOSIS — Z0000 Encounter for general adult medical examination without abnormal findings: Secondary | ICD-10-CM | POA: Diagnosis not present

## 2023-05-29 DIAGNOSIS — E782 Mixed hyperlipidemia: Secondary | ICD-10-CM | POA: Diagnosis not present

## 2023-05-29 DIAGNOSIS — E559 Vitamin D deficiency, unspecified: Secondary | ICD-10-CM | POA: Diagnosis not present

## 2023-05-29 DIAGNOSIS — M810 Age-related osteoporosis without current pathological fracture: Secondary | ICD-10-CM | POA: Diagnosis not present

## 2023-05-29 LAB — LAB REPORT - SCANNED: EGFR: 76

## 2023-05-29 LAB — HM HEPATITIS C SCREENING LAB: HM Hepatitis Screen: NEGATIVE

## 2023-06-02 DIAGNOSIS — Z1211 Encounter for screening for malignant neoplasm of colon: Secondary | ICD-10-CM | POA: Diagnosis not present

## 2023-06-02 DIAGNOSIS — Z0000 Encounter for general adult medical examination without abnormal findings: Secondary | ICD-10-CM | POA: Diagnosis not present

## 2023-06-02 DIAGNOSIS — Z6821 Body mass index (BMI) 21.0-21.9, adult: Secondary | ICD-10-CM | POA: Diagnosis not present

## 2023-06-21 ENCOUNTER — Ambulatory Visit
Admission: RE | Admit: 2023-06-21 | Discharge: 2023-06-21 | Disposition: A | Discharge status: Home / Self Care | Source: Ambulatory Visit | Attending: Family Medicine | Admitting: Family Medicine

## 2023-06-21 DIAGNOSIS — Z09 Encounter for follow-up examination after completed treatment for conditions other than malignant neoplasm: Secondary | ICD-10-CM | POA: Diagnosis not present

## 2023-06-21 DIAGNOSIS — R911 Solitary pulmonary nodule: Secondary | ICD-10-CM | POA: Diagnosis not present

## 2023-09-28 DIAGNOSIS — H04123 Dry eye syndrome of bilateral lacrimal glands: Secondary | ICD-10-CM | POA: Diagnosis not present

## 2023-09-28 DIAGNOSIS — H2513 Age-related nuclear cataract, bilateral: Secondary | ICD-10-CM | POA: Diagnosis not present

## 2023-09-28 DIAGNOSIS — H524 Presbyopia: Secondary | ICD-10-CM | POA: Diagnosis not present

## 2023-11-17 ENCOUNTER — Ambulatory Visit (HOSPITAL_BASED_OUTPATIENT_CLINIC_OR_DEPARTMENT_OTHER): Admitting: Family Medicine

## 2023-11-17 ENCOUNTER — Encounter (HOSPITAL_BASED_OUTPATIENT_CLINIC_OR_DEPARTMENT_OTHER): Payer: Self-pay | Admitting: Family Medicine

## 2023-11-17 ENCOUNTER — Other Ambulatory Visit (HOSPITAL_BASED_OUTPATIENT_CLINIC_OR_DEPARTMENT_OTHER): Payer: Self-pay

## 2023-11-17 VITALS — BP 132/70 | HR 55 | Ht 65.5 in | Wt 133.0 lb

## 2023-11-17 DIAGNOSIS — Z23 Encounter for immunization: Secondary | ICD-10-CM | POA: Diagnosis not present

## 2023-11-17 DIAGNOSIS — M818 Other osteoporosis without current pathological fracture: Secondary | ICD-10-CM | POA: Diagnosis not present

## 2023-11-17 DIAGNOSIS — K5730 Diverticulosis of large intestine without perforation or abscess without bleeding: Secondary | ICD-10-CM | POA: Insufficient documentation

## 2023-11-17 DIAGNOSIS — Z83719 Family history of colon polyps, unspecified: Secondary | ICD-10-CM | POA: Insufficient documentation

## 2023-11-17 DIAGNOSIS — K219 Gastro-esophageal reflux disease without esophagitis: Secondary | ICD-10-CM | POA: Insufficient documentation

## 2023-11-17 DIAGNOSIS — Z800 Family history of malignant neoplasm of digestive organs: Secondary | ICD-10-CM | POA: Insufficient documentation

## 2023-11-17 DIAGNOSIS — Z853 Personal history of malignant neoplasm of breast: Secondary | ICD-10-CM | POA: Diagnosis not present

## 2023-11-17 DIAGNOSIS — Z7689 Persons encountering health services in other specified circumstances: Secondary | ICD-10-CM

## 2023-11-17 DIAGNOSIS — Z87738 Personal history of other specified (corrected) congenital malformations of digestive system: Secondary | ICD-10-CM | POA: Insufficient documentation

## 2023-11-17 DIAGNOSIS — Z860100 Personal history of colon polyps, unspecified: Secondary | ICD-10-CM | POA: Insufficient documentation

## 2023-11-17 DIAGNOSIS — R1310 Dysphagia, unspecified: Secondary | ICD-10-CM | POA: Insufficient documentation

## 2023-11-17 DIAGNOSIS — K449 Diaphragmatic hernia without obstruction or gangrene: Secondary | ICD-10-CM | POA: Insufficient documentation

## 2023-11-17 MED ORDER — ALENDRONATE SODIUM 70 MG PO TABS: 70.0000 mg | ORAL_TABLET | ORAL | Status: AC

## 2023-11-17 MED ORDER — COVID-19 MRNA VAC-TRIS(PFIZER) 30 MCG/0.3ML IM SUSY
0.3000 mL | PREFILLED_SYRINGE | Freq: Once | INTRAMUSCULAR | 0 refills | Status: AC
  Filled 2023-11-17: qty 0.3, 1d supply, fill #0

## 2023-11-17 NOTE — Progress Notes (Signed)
 New Patient Office Visit  Subjective:   Ariel Mosley 1951-02-27 11/17/2023  Chief Complaint  Patient presents with   New Patient (Initial Visit)    Patient is here today to get established with the practice. Denies any main concerns for today's visit.    Discussed the use of AI scribe software for clinical note transcription with the patient, who gave verbal consent to proceed.  History of Present Illness Ariel Mosley is a 73 year old female who presents for a new patient appointment and medication review. Last PCP: Waddell Parkins, FNP  Concerns: See below   OSTEOPOROSIS:  She is currently taking alendronate  (Fosamax ) once a week for osteoporosis, along with a daily calcium  and vitamin D3 supplement. Unsure of last DEXA but states it is likely within the past 2 years. She denies adverse side effects of Fosamax .   HISTORY OF BREAST CANCER:  Her medical history includes breast cancer diagnosed in 2003, treated with lumpectomy, chemotherapy, and radiation. She participated in a clinical trial for Herceptin and took tamoxifen. She continues to have annual mammograms, with the last one likely in December 2024 at [facility].   Family history is significant for colon cancer, and she has a personal history of colon polyps. Her last colonoscopy was around 2020 at [facility] with Dr. Kristie.  She has lived in [place] since the 1970s, originally [place]  The following portions of the patient's history were reviewed and updated as appropriate: past medical history, past surgical history, family history, social history, allergies, medications, and problem list.   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     Herpes    Genital    History of chicken pox    History of measles, mumps, or rubella    She had mumps and measles.   Hyperlipidemia    Renal cyst    Restless leg syndrome 10/24/2013   RLS (restless legs syndrome)    Vaginal discharge 12/01/2011   Yeast infection    Past Surgical History:  Procedure Laterality Date   ABDOMINAL HYSTERECTOMY     AXILLARY LYMPH NODE DISSECTION  09/26/2001   BILATERAL SALPINGOOPHORECTOMY  03/07/1998   BREAST LUMPECTOMY  09/26/2001   Right    TONSILLECTOMY     TOTAL ABDOMINAL HYSTERECTOMY W/ BILATERAL SALPINGOOPHORECTOMY  03/07/1998   Fibroids    TUBAL LIGATION     WISDOM TOOTH EXTRACTION     Family History  Problem Relation Age of Onset   Heart disease Mother    Hyperlipidemia Mother    Alzheimer's disease Mother    Heart disease Maternal Grandmother  Hyperlipidemia Maternal Grandmother    Varicose Veins Maternal Grandmother    Lung cancer Paternal Uncle    Social History   Socioeconomic History   Marital status: Married    Spouse name: Elsie    Number of children: 2   Years of education: 16   Highest education level: Master's degree (e.g., MA, MS, MEng, MEd, MSW, MBA)  Occupational History   Occupation: SOCIAL WORKER    Employer: [employer]  Tobacco Use   Smoking status: Never   Smokeless tobacco: Never   Tobacco comments:    quit date 1984  Substance and Sexual Activity   Alcohol use: Yes     Alcohol/week: 4.0 - 5.0 standard drinks of alcohol    Types: 4 - 5 Glasses of wine per week    Comment: Occasional   Drug use: No   Sexual activity: Yes    Partners: Male    Birth control/protection: Post-menopausal  Other Topics Concern   Not on file  Social History Narrative   Marital Status: Married Film/video editor)   Children:  Sons (Ben, Will)    Pets: Dogs (2)    Living Situation: Lives with husband   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[employer])   Education: Engineer, maintenance (IT)    Tobacco Use/Exposure:  None    Alcohol Use:  Occasional   Drug Use:  None   Diet:  Regular   Exercise:  Walking (4-5 X week)    Hobbies: Herbalist             Social Drivers of Corporate investment banker Strain: Low Risk  (11/13/2023)   Overall Financial Resource Strain (CARDIA)    Difficulty of Paying Living Expenses: Not hard at all  Food Insecurity: No Food Insecurity (11/13/2023)   Hunger Vital Sign    Worried About Running Out of Food in the Last Year: Never true    Ran Out of Food in the Last Year: Never true  Transportation Needs: No Transportation Needs (11/13/2023)   PRAPARE - Administrator, Civil Service (Medical): No    Lack of Transportation (Non-Medical): No  Physical Activity: Sufficiently Active (11/13/2023)   Exercise Vital Sign    Days of Exercise per Week: 5 days    Minutes of Exercise per Session: 40 min  Stress: No Stress Concern Present (11/13/2023)   [employer] of Occupational Health - Occupational Stress Questionnaire    Feeling of Stress: Only a little  Social Connections: Socially Integrated (11/13/2023)   Social Connection and Isolation Panel    Frequency of Communication with Friends and Family: More than three times a week    Frequency of Social Gatherings with Friends and Family: Twice a week    Attends Religious Services: More than 4 times per year    Active Member of [employer] or Organizations: Yes    Attends Museum/gallery exhibitions officer: More than 4 times per year    Marital Status: Married  Catering manager Violence: Not on file   Outpatient Medications Prior to Visit  Medication Sig Dispense Refill   Calcium  Carb-Cholecalciferol (CALCIUM  + VITAMIN D3 PO) Take by mouth.     MELATONIN PO Take by mouth.     alendronate  (FOSAMAX ) 70 MG tablet Take by mouth.     Calcium  Carb-Cholecalciferol (OYSTER SHELL CALCIUM  W/D) 500-5 MG-MCG TABS Take 1 tablet by mouth.     promethazine  (PHENERGAN ) 25 MG tablet Take 1/4 - 1 tab up to  4 x per day as needed for nausea 60 tablet 4   RABEprazole  (ACIPHEX ) 20 MG tablet Take 1 tablet (20 mg total) by mouth daily. 30 tablet 11   rosuvastatin  (CRESTOR ) 20 MG tablet Take 1 tablet (20 mg total) by mouth daily. 30 tablet 0   No facility-administered medications prior to visit.   Allergies  Allergen Reactions   Penicillins Hives    Per 09/28/2001 New Patient Evaluation (Magrinat).    ROS: A complete ROS was performed with pertinent positives/negatives noted in the HPI. The remainder of the ROS are negative.   Objective:   Today's Vitals   11/17/23 0829 11/17/23 0906  BP: (!) 145/71 132/70  Pulse: (!) 55   SpO2: 100%   Weight: 133 lb (60.3 kg)   Height: 5' 5.5 (1.664 m)     GENERAL: Well-appearing, in NAD. Well nourished.  SKIN: Pink, warm and dry.   Head: Normocephalic. NECK: Trachea midline. Full ROM w/o pain or tenderness.  RESPIRATORY: Chest wall symmetrical. Respirations even and non-labored. Breath sounds clear to auscultation bilaterally.  CARDIAC: S1, S2 present, regular rate and rhythm without murmur or gallops. Peripheral pulses 2+ bilaterally.  MSK: Muscle tone and strength appropriate for age. NEUROLOGIC: No motor or sensory deficits. Steady, even gait. C2-C12 intact.  PSYCH/MENTAL STATUS: Alert, oriented x 3. Cooperative, appropriate mood and affect.    Health Maintenance Due  Topic Date Due   Hepatitis C Screening  Never done   Colonoscopy   03/07/2014   DEXA SCAN  06/12/2015   COVID-19 Vaccine (9 - Pfizer risk 2024-25 season) 11/06/2023   Mammogram  11/17/2023    No results found for any visits on 11/17/23.     Assessment & Plan:   1. Encounter to establish care with new doctor (Primary) Discussed role of PCP with patient and reviewed her medical history in depth. Will obtain records from [place] PCP for review.   2. History of breast cancer Patient currently UTD on mammogram. Will obtain records.   3. Other osteoporosis without current pathological fracture Will obtain previous DXA and records from PCP regarding Fosamax  therapy. Will repeat calcium  and vit d at time of AE.   4. Immunization due Covid 19 vaccine sent in to pharmacy due to preexisting conditions.    Patient to reach out to office if new, worrisome, or unresolved symptoms arise or if no improvement in patient's condition. Patient verbalized understanding and is agreeable to treatment plan. All questions answered to patient's satisfaction.    Return in about 6 weeks (around 12/29/2023) for ANNUAL PHYSICAL.    Thersia Schuyler Stark, [place]

## 2023-11-29 ENCOUNTER — Encounter (HOSPITAL_BASED_OUTPATIENT_CLINIC_OR_DEPARTMENT_OTHER): Payer: Self-pay | Admitting: Family Medicine

## 2023-11-29 DIAGNOSIS — L814 Other melanin hyperpigmentation: Secondary | ICD-10-CM | POA: Diagnosis not present

## 2023-11-29 DIAGNOSIS — D2261 Melanocytic nevi of right upper limb, including shoulder: Secondary | ICD-10-CM | POA: Diagnosis not present

## 2023-11-29 DIAGNOSIS — D225 Melanocytic nevi of trunk: Secondary | ICD-10-CM | POA: Diagnosis not present

## 2023-11-29 DIAGNOSIS — L821 Other seborrheic keratosis: Secondary | ICD-10-CM | POA: Diagnosis not present

## 2023-11-29 DIAGNOSIS — D2271 Melanocytic nevi of right lower limb, including hip: Secondary | ICD-10-CM | POA: Diagnosis not present

## 2024-01-02 ENCOUNTER — Ambulatory Visit (INDEPENDENT_AMBULATORY_CARE_PROVIDER_SITE_OTHER): Admitting: Family Medicine

## 2024-01-02 ENCOUNTER — Encounter (HOSPITAL_BASED_OUTPATIENT_CLINIC_OR_DEPARTMENT_OTHER): Payer: Self-pay | Admitting: Family Medicine

## 2024-01-02 ENCOUNTER — Encounter (HOSPITAL_BASED_OUTPATIENT_CLINIC_OR_DEPARTMENT_OTHER): Payer: Self-pay

## 2024-01-02 VITALS — BP 110/61 | HR 59 | Ht 65.5 in | Wt 133.0 lb

## 2024-01-02 DIAGNOSIS — Z13220 Encounter for screening for lipoid disorders: Secondary | ICD-10-CM

## 2024-01-02 DIAGNOSIS — I7300 Raynaud's syndrome without gangrene: Secondary | ICD-10-CM | POA: Diagnosis not present

## 2024-01-02 DIAGNOSIS — M818 Other osteoporosis without current pathological fracture: Secondary | ICD-10-CM | POA: Diagnosis not present

## 2024-01-02 DIAGNOSIS — Z0000 Encounter for general adult medical examination without abnormal findings: Secondary | ICD-10-CM

## 2024-01-02 DIAGNOSIS — Z1211 Encounter for screening for malignant neoplasm of colon: Secondary | ICD-10-CM | POA: Diagnosis not present

## 2024-01-02 NOTE — Patient Instructions (Signed)
 Schedule Colonoscopy   Things to do to keep yourself healthy: - Exercise at least 30-45 minutes a day, 3-4 days a week.  - Eat a low-fat diet with lots of fruits and vegetables, up to 7-9 servings per day.  - Seatbelts can save your life. Wear them always.  - Smoke detectors on every level of your home, check batteries every year.  - Eye Doctor - have an eye exam every 1-2 years  - Safe sex - if you may be exposed to STDs, use a condom.  - No smoking, vaping, or use of any tobacco products.  - Alcohol -  If you drink, do it moderately, less than 2 drinks per day.  - No illegal drug use.  - Depression is common in our stressful world.If you're feeling down or losing interest in things you normally enjoy, please come in for a visit.  - Violence - If anyone is threatening or hurting you, please call immediately.

## 2024-01-02 NOTE — Progress Notes (Signed)
 Subjective:   Ariel Mosley 06-30-1950  01/02/2024   CC: Chief Complaint  Patient presents with   Annual Exam    Patient is here today for her physical. States she has some fingers that will become numb and even get white in color and she wonders if it could be Raynaud's.    HPI: Ariel Mosley is a 73 y.o. female who presents for a routine health maintenance exam.  Labs collected at time of visit.    RAYNAUD's CONCERN:  Patient states at times the area of her knuckles and tips of her fingers will become numb, cold and discolored with a pale white coloration. She states symptoms can last from 10 minutes to 45 minutes. She states this is occurring approx. Once a week. She did smoke previously and quit in 1984. Denies chest pain, shortness of breath, discoloration to toes or dizziness that occurs at this time.    HEALTH SCREENINGS: - Vision Screening: up to date - Dental Visits: up to date - Pap smear: not applicable - Breast Exam: Declined - STD Screening: Declined - Mammogram (40+): UTD; Scheduled for January 2026 with [facility]   - Colonoscopy (45+): Ordered today  - Bone Density (65+ or under 65 with predisposing conditions): Up to date  - Lung CA screening with low-dose CT:  Not applicable Adults age 19-80 who are current cigarette smokers or quit within the last 15 years. Must have 20 pack year history.   Depression and Anxiety Screen done today and results listed below:     01/02/2024   10:01 AM 11/17/2023    8:34 AM  Depression screen PHQ 2/9  Decreased Interest 0 0  Down, Depressed, Hopeless 0 0  PHQ - 2 Score 0 0  Altered sleeping 2 2  Tired, decreased energy 0 0  Change in appetite 0 0  Feeling bad or failure about yourself  0 0  Trouble concentrating 0 0  Moving slowly or fidgety/restless 0 0  Suicidal thoughts 0 0  PHQ-9 Score 2 2  Difficult doing work/chores Not difficult at all Not difficult at all      01/02/2024   10:01 AM 11/17/2023    8:34 AM   GAD 7 : Generalized Anxiety Score  Nervous, Anxious, on Edge 0 0  Control/stop worrying 0 0  Worry too much - different things 0 0  Trouble relaxing 0 0  Restless 0 0  Easily annoyed or irritable 0 0  Afraid - awful might happen 0 0  Total GAD 7 Score 0 0  Anxiety Difficulty Not difficult at all Not difficult at all    IMMUNIZATIONS: - Tdap: Tetanus vaccination status reviewed: last tetanus booster within 10 years. - HPV: Not applicable - Influenza: UTD per patient  - Pneumovax: Up to date - Prevnar 20: Up to date - Shingrix (50+): Up to date   Past medical history, surgical history, medications, allergies, family history and social history reviewed with patient today and changes made to appropriate areas of the chart.   Past Medical History:  Diagnosis Date   Breast cancer (HCC) 09/04/2001   Breast lump    Cataract    Ophthalmologist says I have cataracts but surgery not recommended yet   Depression    Diaphragmatic hernia 11/17/2023   Diverticular disease of colon 11/17/2023   Dysphagia 11/17/2023   Family history of colonic polyps 11/17/2023   Fibroids    GERD (gastroesophageal reflux disease)    episodic, less often than used  to be   Gestational diabetes    With her last pregnancy only.     Herpes    Genital    History of chicken pox    History of measles, mumps, or rubella    She had mumps and measles.   Hyperlipidemia    Renal cyst    Restless leg syndrome 10/24/2013   RLS (restless legs syndrome)    Vaginal discharge 12/01/2011   Yeast infection     Past Surgical History:  Procedure Laterality Date   ABDOMINAL HYSTERECTOMY     AXILLARY LYMPH NODE DISSECTION  09/26/2001   BILATERAL SALPINGOOPHORECTOMY  03/07/1998   BREAST LUMPECTOMY  09/26/2001   Right    TONSILLECTOMY     TOTAL ABDOMINAL HYSTERECTOMY W/ BILATERAL SALPINGOOPHORECTOMY  03/07/1998   Fibroids    TUBAL LIGATION     WISDOM TOOTH EXTRACTION      Current Outpatient Medications on  File Prior to Visit  Medication Sig   alendronate  (FOSAMAX ) 70 MG tablet Take 1 tablet (70 mg total) by mouth once a week.   Calcium  Carb-Cholecalciferol (CALCIUM  + VITAMIN D3 PO) Take by mouth.   MELATONIN PO Take by mouth.   Multiple Vitamins-Minerals (PRESERVISION AREDS PO) Take by mouth.   No current facility-administered medications on file prior to visit.    Allergies  Allergen Reactions   Penicillins Hives    Per 09/28/2001 New Patient Evaluation (Magrinat).     Social History   Socioeconomic History   Marital status: Married    Spouse name: Elsie    Number of children: 2   Years of education: 16   Highest education level: Master's degree (e.g., MA, MS, MEng, MEd, MSW, MBA)  Occupational History   Occupation: SOCIAL WORKER    Employer: [employer]  Tobacco Use   Smoking status: Never   Smokeless tobacco: Never   Tobacco comments:    quit date 1984  Substance and Sexual Activity   Alcohol use: Yes    Alcohol/week: 4.0 - 5.0 standard drinks of alcohol    Types: 4 - 5 Glasses of wine per week    Comment: Occasional   Drug use: No   Sexual activity: Yes    Partners: Male    Birth control/protection: Post-menopausal  Other Topics Concern   Not on file  Social History Narrative   Marital Status: Married Film/video Editor)   Children:  Sons (Ben, Will)    Pets: Dogs (2)    Living Situation: Lives with husband   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[employer])   Education: Engineer, Maintenance (it)    Tobacco Use/Exposure:  None    Alcohol Use:  Occasional   Drug Use:  None   Diet:  Regular   Exercise:  Walking (4-5 X week)    Hobbies: Herbalist             Social Drivers of Corporate Investment Banker Strain: Low Risk  (01/01/2024)   Overall Financial Resource Strain (CARDIA)    Difficulty of Paying Living Expenses: Not hard at all  Food Insecurity: No Food Insecurity (01/01/2024)   Hunger Vital Sign    Worried About  Running Out of Food in the Last Year: Never true    Ran Out of Food in the Last Year: Never true  Transportation Needs: No Transportation Needs (01/01/2024)   PRAPARE - Administrator, Civil Service (Medical): No    Lack of Transportation (Non-Medical): No  Physical  Activity: Sufficiently Active (01/01/2024)   Exercise Vital Sign    Days of Exercise per Week: 5 days    Minutes of Exercise per Session: 50 min  Stress: No Stress Concern Present (01/01/2024)   [employer] of Occupational Health - Occupational Stress Questionnaire    Feeling of Stress: Only a little  Social Connections: Socially Integrated (01/01/2024)   Social Connection and Isolation Panel    Frequency of Communication with Friends and Family: More than three times a week    Frequency of Social Gatherings with Friends and Family: Three times a week    Attends Religious Services: More than 4 times per year    Active Member of Clubs or Organizations: Yes    Attends Engineer, Structural: More than 4 times per year    Marital Status: Married  Catering Manager Violence: Not on file   Social History   Tobacco Use  Smoking Status Never  Smokeless Tobacco Never  Tobacco Comments   quit date 1984   Social History   Substance and Sexual Activity  Alcohol Use Yes   Alcohol/week: 4.0 - 5.0 standard drinks of alcohol   Types: 4 - 5 Glasses of wine per week   Comment: Occasional    Family History  Problem Relation Age of Onset   Heart disease Mother    Hyperlipidemia Mother    Alzheimer's disease Mother    Heart disease Maternal Grandmother    Hyperlipidemia Maternal Grandmother    Varicose Veins Maternal Grandmother    Lung cancer Paternal Uncle      ROS: Denies fever, fatigue, unexplained weight loss/gain, chest pain, SHOB, and palpitations. Denies neurological deficits, gastrointestinal or genitourinary complaints, and skin changes.   Objective:   Today's Vitals   01/02/24 0959   BP: 110/61  Pulse: (!) 59  SpO2: 100%  Weight: 133 lb (60.3 kg)  Height: 5' 5.5 (1.664 m)    GENERAL APPEARANCE: Well-appearing, in NAD. Well nourished.  SKIN: Pink, warm and dry. Turgor normal. No rash, lesion, ulceration, or ecchymoses. Hair evenly distributed.  HEENT: HEAD: Normocephalic.  EYES: PERRLA. EOMI. Lids intact w/o defect. Sclera white, Conjunctiva pink w/o exudate.  EARS: External ear w/o redness, swelling, masses or lesions. EAC clear. TM's intact, translucent w/o bulging, appropriate landmarks visualized. Appropriate acuity to conversational tones.  NOSE: Septum midline w/o deformity. Nares patent, mucosa pink and non-inflamed w/o drainage. No sinus tenderness.  THROAT: Uvula midline. Oropharynx clear. Oral mucosa pink and moist.  NECK: Supple, Trachea midline. Full ROM w/o pain or tenderness. No lymphadenopathy. Thyroid  non-tender w/o enlargement or palpable masses.  RESPIRATORY: Chest wall symmetrical w/o masses. Respirations even and non-labored. Breath sounds clear to auscultation bilaterally. No wheezes, rales, rhonchi, or crackles. CARDIAC: S1, S2 present, regular rate and rhythm. No gallops, murmurs, rubs, or clicks. PMI w/o lifts, heaves, or thrills. No carotid bruits. Capillary refill <2 seconds. Peripheral pulses 2+ bilaterally. GI: Abdomen soft w/o distention. Normoactive bowel sounds. No palpable masses or tenderness. No guarding or rebound tenderness. Liver and spleen w/o tenderness or enlargement. No CVA tenderness.  MSK: Muscle tone and strength appropriate for age, w/o atrophy or abnormal movement.  EXTREMITIES: Active ROM intact, w/o tenderness, crepitus, or contracture. No obvious joint deformities or effusions. No clubbing, edema, or cyanosis. Cap refill is less than 3 seconds to bilateral upper extremities. Radial pulses are +3 bilaterally.  NEUROLOGIC: CN's II-XII intact. Motor strength symmetrical with no obvious weakness. No sensory deficits. DTR's 2+  symmetric bilaterally. Steady, even  gait.  PSYCH/MENTAL STATUS: Alert, oriented x 3. Cooperative, appropriate mood and affect.     Assessment & Plan:  1. Annual physical exam (Primary) Discussed preventative screenings, vaccines, and healthy lifestyle with patient. UTD on vaccinations. Fasting labs completed in office today.  - CBC with Differential/Platelet - Comprehensive metabolic panel with GFR - Lipid panel - TSH  2. Screening for lipid disorders - Lipid panel  3. Other osteoporosis without current pathological fracture Patient continues on Fosamax  70 mg once weekly. Will check Vitamin D  with labwork today. Due for DXA in 2026.  - VITAMIN D  25 Hydroxy (Vit-D Deficiency, Fractures)  4. Screening for colon cancer Referral placed to Dr. Nola office per patient request.  - Ambulatory referral to Gastroenterology  5. Raynaud's phenomenon without gangrene Discussed pathophysiology of Raynaud's, symptoms, and management of symptoms. Discussed if severity, frequency occurs, will consider medication therapy. Given BP and HR well controlled and symptoms intermittent, will proceed with conservative management.No signs of PAD, DVT on exam.    Orders Placed This Encounter  Procedures   CBC with Differential/Platelet   Comprehensive metabolic panel with GFR   Lipid panel   TSH   VITAMIN D  25 Hydroxy (Vit-D Deficiency, Fractures)   Ambulatory referral to Gastroenterology    Referral Priority:   Routine    Referral Type:   Consultation    Referral Reason:   Specialty Services Required    Number of Visits Requested:   1    PATIENT COUNSELING:  - Encouraged a healthy well-balanced diet. Patient may adjust caloric intake to maintain or achieve ideal body weight. May reduce intake of dietary saturated fat and total fat and have adequate dietary potassium and calcium  preferably from fresh fruits, vegetables, and low-fat dairy products.   - Advised to avoid cigarette smoking. -  Discussed with the patient that most people either abstain from alcohol or drink within safe limits (<=14/week and <=4 drinks/occasion for males, <=7/weeks and <= 3 drinks/occasion for females) and that the risk for alcohol disorders and other health effects rises proportionally with the number of drinks per week and how often a drinker exceeds daily limits. - Discussed cessation/primary prevention of drug use and availability of treatment for abuse.  - Discussed sexually transmitted diseases, avoidance of unintended pregnancy and contraceptive alternatives.  - Stressed the importance of regular exercise - Injury prevention: Discussed safety belts, safety helmets, smoke detector, smoking near bedding or upholstery.  - Dental health: Discussed importance of regular tooth brushing, flossing, and dental visits.   NEXT PREVENTATIVE PHYSICAL DUE IN 1 YEAR.  Return in about 1 year (around 01/01/2025) for ANNUAL PHYSICAL.  Patient to reach out to office if new, worrisome, or unresolved symptoms arise or if no improvement in patient's condition. Patient verbalized understanding and is agreeable to treatment plan. All questions answered to patient's satisfaction.    Thersia Schuyler Stark, [place]

## 2024-01-03 ENCOUNTER — Ambulatory Visit (HOSPITAL_BASED_OUTPATIENT_CLINIC_OR_DEPARTMENT_OTHER): Payer: Self-pay | Admitting: Family Medicine

## 2024-01-03 LAB — CBC WITH DIFFERENTIAL/PLATELET
Basophils Absolute: 0 x10E3/uL (ref 0.0–0.2)
Basos: 0 %
EOS (ABSOLUTE): 0.1 x10E3/uL (ref 0.0–0.4)
Eos: 2 %
Hematocrit: 37.1 % (ref 34.0–46.6)
Hemoglobin: 12 g/dL (ref 11.1–15.9)
Immature Grans (Abs): 0 x10E3/uL (ref 0.0–0.1)
Immature Granulocytes: 0 %
Lymphocytes Absolute: 1.4 x10E3/uL (ref 0.7–3.1)
Lymphs: 31 %
MCH: 31.4 pg (ref 26.6–33.0)
MCHC: 32.3 g/dL (ref 31.5–35.7)
MCV: 97 fL (ref 79–97)
Monocytes Absolute: 0.4 x10E3/uL (ref 0.1–0.9)
Monocytes: 8 %
Neutrophils Absolute: 2.8 x10E3/uL (ref 1.4–7.0)
Neutrophils: 59 %
Platelets: 279 x10E3/uL (ref 150–450)
RBC: 3.82 x10E6/uL (ref 3.77–5.28)
RDW: 11.6 % — ABNORMAL LOW (ref 11.7–15.4)
WBC: 4.7 x10E3/uL (ref 3.4–10.8)

## 2024-01-03 LAB — TSH: TSH: 1.63 u[IU]/mL (ref 0.450–4.500)

## 2024-01-03 LAB — LIPID PANEL
Chol/HDL Ratio: 2.6 ratio (ref 0.0–4.4)
Cholesterol, Total: 226 mg/dL — ABNORMAL HIGH (ref 100–199)
HDL: 86 mg/dL (ref >39)
LDL Chol Calc (NIH): 129 mg/dL — ABNORMAL HIGH (ref 0–99)
Triglycerides: 64 mg/dL (ref 0–149)
VLDL Cholesterol Cal: 11 mg/dL (ref 5–40)

## 2024-01-03 LAB — COMPREHENSIVE METABOLIC PANEL WITH GFR
ALT: 12 IU/L (ref 0–32)
AST: 23 IU/L (ref 0–40)
Albumin: 4.1 g/dL (ref 3.8–4.8)
Alkaline Phosphatase: 73 IU/L (ref 49–135)
BUN/Creatinine Ratio: 16 (ref 12–28)
BUN: 14 mg/dL (ref 8–27)
Bilirubin Total: 0.6 mg/dL (ref 0.0–1.2)
CO2: 23 mmol/L (ref 20–29)
Calcium: 10 mg/dL (ref 8.7–10.3)
Chloride: 102 mmol/L (ref 96–106)
Creatinine, Ser: 0.87 mg/dL (ref 0.57–1.00)
Globulin, Total: 2.9 g/dL (ref 1.5–4.5)
Glucose: 90 mg/dL (ref 70–99)
Potassium: 4.3 mmol/L (ref 3.5–5.2)
Sodium: 138 mmol/L (ref 134–144)
Total Protein: 7 g/dL (ref 6.0–8.5)
eGFR: 71 mL/min/1.73 (ref >59)

## 2024-01-03 LAB — VITAMIN D 25 HYDROXY (VIT D DEFICIENCY, FRACTURES): Vit D, 25-Hydroxy: 34.3 ng/mL (ref 30.0–100.0)

## 2024-01-03 NOTE — Progress Notes (Signed)
 Hi Ariel Mosley,  Your blood counts are stable. Your total cholesterol and LDL cholesterol has improved from your last check in March 2025. Continue a good heart healthy diet and staying active. Your electrolytes, kidney and liver function are stable. Your Vitamin D  is normal and thyroid  function is controlled.

## 2024-01-04 ENCOUNTER — Encounter (HOSPITAL_BASED_OUTPATIENT_CLINIC_OR_DEPARTMENT_OTHER): Payer: Self-pay | Admitting: Family Medicine

## 2024-01-04 NOTE — Telephone Encounter (Signed)
 Please see mychart message sent by pt and advise.

## 2024-03-29 LAB — HM MAMMOGRAPHY

## 2024-04-02 ENCOUNTER — Encounter: Payer: Self-pay | Admitting: Family Medicine

## 2025-01-02 ENCOUNTER — Encounter (HOSPITAL_BASED_OUTPATIENT_CLINIC_OR_DEPARTMENT_OTHER): Admitting: Family Medicine
